# Patient Record
Sex: Female | Born: 1935
Health system: Southern US, Community
[De-identification: ages and names within clinical notes are randomized; demographics above are authoritative.]

## PROBLEM LIST (undated history)

## (undated) DIAGNOSIS — I10 Essential (primary) hypertension: Secondary | ICD-10-CM

## (undated) DIAGNOSIS — K219 Gastro-esophageal reflux disease without esophagitis: Secondary | ICD-10-CM

## (undated) DIAGNOSIS — K635 Polyp of colon: Secondary | ICD-10-CM

## (undated) DIAGNOSIS — K449 Diaphragmatic hernia without obstruction or gangrene: Secondary | ICD-10-CM

## (undated) DIAGNOSIS — E039 Hypothyroidism, unspecified: Secondary | ICD-10-CM

## (undated) DIAGNOSIS — K589 Irritable bowel syndrome without diarrhea: Secondary | ICD-10-CM

## (undated) DIAGNOSIS — E785 Hyperlipidemia, unspecified: Secondary | ICD-10-CM

## (undated) HISTORY — DX: Gastro-esophageal reflux disease without esophagitis: K21.9

## (undated) HISTORY — PX: CHOLECYSTECTOMY: SHX55

## (undated) HISTORY — PX: BACK SURGERY: SHX140

## (undated) HISTORY — PX: CATARACT EXTRACTION: SUR2

## (undated) HISTORY — DX: Irritable bowel syndrome, unspecified: K58.9

## (undated) HISTORY — PX: APPENDECTOMY: SHX54

## (undated) HISTORY — DX: Diaphragmatic hernia without obstruction or gangrene: K44.9

## (undated) HISTORY — DX: Essential (primary) hypertension: I10

## (undated) HISTORY — DX: Polyp of colon: K63.5

## (undated) HISTORY — PX: ABDOMINAL HYSTERECTOMY: SHX81

## (undated) HISTORY — DX: Hyperlipidemia, unspecified: E78.5

## (undated) HISTORY — PX: WRIST FRACTURE SURGERY: SHX121

## (undated) HISTORY — PX: CERVICAL FUSION: SHX112

## (undated) HISTORY — DX: Hypothyroidism, unspecified: E03.9

---

## 1998-06-09 ENCOUNTER — Ambulatory Visit (HOSPITAL_COMMUNITY): Admission: RE | Admit: 1998-06-09 | Discharge: 1998-06-09 | Payer: Self-pay | Admitting: Neurosurgery

## 1998-09-01 ENCOUNTER — Observation Stay (HOSPITAL_COMMUNITY): Admission: RE | Admit: 1998-09-01 | Discharge: 1998-09-02 | Payer: Self-pay | Admitting: Orthopaedic Surgery

## 1998-11-16 ENCOUNTER — Ambulatory Visit (HOSPITAL_COMMUNITY): Admission: RE | Admit: 1998-11-16 | Discharge: 1998-11-16 | Payer: Self-pay | Admitting: *Deleted

## 1999-09-01 ENCOUNTER — Other Ambulatory Visit: Admission: RE | Admit: 1999-09-01 | Discharge: 1999-09-01 | Payer: Self-pay | Admitting: Gastroenterology

## 2000-11-24 ENCOUNTER — Ambulatory Visit (HOSPITAL_COMMUNITY): Admission: RE | Admit: 2000-11-24 | Discharge: 2000-11-24 | Payer: Self-pay | Admitting: Surgery

## 2000-11-24 ENCOUNTER — Encounter: Payer: Self-pay | Admitting: Surgery

## 2004-11-04 ENCOUNTER — Ambulatory Visit: Payer: Self-pay | Admitting: Gastroenterology

## 2004-11-17 ENCOUNTER — Ambulatory Visit: Payer: Self-pay | Admitting: Gastroenterology

## 2004-11-30 ENCOUNTER — Ambulatory Visit: Payer: Self-pay | Admitting: Gastroenterology

## 2004-12-14 ENCOUNTER — Ambulatory Visit: Payer: Self-pay | Admitting: Gastroenterology

## 2004-12-15 ENCOUNTER — Ambulatory Visit (HOSPITAL_COMMUNITY): Admission: RE | Admit: 2004-12-15 | Discharge: 2004-12-15 | Payer: Self-pay | Admitting: Gastroenterology

## 2004-12-21 ENCOUNTER — Ambulatory Visit: Payer: Self-pay | Admitting: Gastroenterology

## 2006-05-03 ENCOUNTER — Encounter: Admission: RE | Admit: 2006-05-03 | Discharge: 2006-05-03 | Payer: Self-pay | Admitting: Orthopaedic Surgery

## 2006-10-23 ENCOUNTER — Encounter: Admission: RE | Admit: 2006-10-23 | Discharge: 2006-10-23 | Payer: Self-pay | Admitting: Neurosurgery

## 2009-09-18 ENCOUNTER — Encounter (INDEPENDENT_AMBULATORY_CARE_PROVIDER_SITE_OTHER): Payer: Self-pay | Admitting: *Deleted

## 2009-11-08 ENCOUNTER — Encounter: Admission: RE | Admit: 2009-11-08 | Discharge: 2009-11-08 | Payer: Self-pay | Admitting: Orthopaedic Surgery

## 2009-11-09 DIAGNOSIS — K449 Diaphragmatic hernia without obstruction or gangrene: Secondary | ICD-10-CM | POA: Insufficient documentation

## 2009-11-09 DIAGNOSIS — E039 Hypothyroidism, unspecified: Secondary | ICD-10-CM | POA: Insufficient documentation

## 2009-11-09 DIAGNOSIS — K589 Irritable bowel syndrome without diarrhea: Secondary | ICD-10-CM

## 2009-11-09 DIAGNOSIS — E785 Hyperlipidemia, unspecified: Secondary | ICD-10-CM | POA: Insufficient documentation

## 2009-11-09 DIAGNOSIS — D126 Benign neoplasm of colon, unspecified: Secondary | ICD-10-CM | POA: Insufficient documentation

## 2009-11-09 DIAGNOSIS — K219 Gastro-esophageal reflux disease without esophagitis: Secondary | ICD-10-CM

## 2009-11-10 ENCOUNTER — Ambulatory Visit: Payer: Self-pay | Admitting: Gastroenterology

## 2009-11-10 DIAGNOSIS — I10 Essential (primary) hypertension: Secondary | ICD-10-CM | POA: Insufficient documentation

## 2009-12-11 ENCOUNTER — Telehealth (INDEPENDENT_AMBULATORY_CARE_PROVIDER_SITE_OTHER): Payer: Self-pay | Admitting: *Deleted

## 2009-12-17 ENCOUNTER — Ambulatory Visit (HOSPITAL_COMMUNITY): Admission: RE | Admit: 2009-12-17 | Discharge: 2009-12-17 | Payer: Self-pay | Admitting: Gastroenterology

## 2009-12-17 ENCOUNTER — Ambulatory Visit: Payer: Self-pay | Admitting: Gastroenterology

## 2010-12-30 NOTE — Progress Notes (Signed)
Summary: Time change  Phone Note Outgoing Call Call back at [facility] Phone [phone]   Call placed by: Chales Abrahams CMA Duncan Dull),  December 11, 2009 10:27 AM Summary of Call: Pt was called and informed that her procedure time was moved up to 11 am instead of 1145 and her arrival time is now 10 am.   Initial call taken by: Chales Abrahams CMA Duncan Dull),  December 11, 2009 10:28 AM

## 2010-12-30 NOTE — Procedures (Signed)
Summary: Colonoscopy  Patient: Laura Riddle Note: All result statuses are Final unless otherwise noted.  Tests: (1) Colonoscopy (COL)   COL Colonoscopy           DONE     [facility]     [address] [place], [place]  [address]           COLONOSCOPY PROCEDURE REPORT           PATIENT:  Laura Riddle, Laura Riddle  MR#:  [id number]     BIRTHDATE:  1935/11/15, 74 yrs. old  GENDER:  female           ENDOSCOPIST:  Rachael Fee, MD     Referred by:  Vania Rea. Jarold Motto, M.D.           PROCEDURE DATE:  12/17/2009     PROCEDURE:  Colonoscopy, Diagnostic     ASA CLASS:  Class II     INDICATIONS:  Elevated Risk Screening, mother had colon cancer in     her 29's; previous colonoscopy with Dr. Jarold Motto about 5 years     ago found hyperplastic polyps only; difficult to adequately sedate     on that exam           MEDICATIONS:   MAC sedation, administered by CRNA           DESCRIPTION OF PROCEDURE:   After the risks benefits and     alternatives of the procedure were thoroughly explained, informed     consent was obtained.  Digital rectal exam was performed and     revealed no rectal masses.   The  endoscope was introduced through     the anus and advanced to the cecum, which was identified by both     the appendix and ileocecal valve, without limitations.  The     quality of the prep was excellent, using MoviPrep.  The instrument     was then slowly withdrawn as the colon was fully examined.           <<PROCEDUREIMAGES>>           FINDINGS:  Internal and external hemorrhoids were found. These     were small and not thrombosed.  This was otherwise a normal     examination of the colon (see image2, image3, and image4).     Retroflexed views in the rectum revealed no abnormalities.    The     scope was then withdrawn from the patient and the procedure     completed.           COMPLICATIONS:  None           ENDOSCOPIC IMPRESSION:     1) Small internal and external  hemorrhoids     2) Otherwise normal examination; no polyps or cancers           RECOMMENDATIONS:     1) Given your significant family history of colon cancer, you     should have a repeat colonoscopy in 5 years           REPEAT EXAM:  in 5 years with deep sedation (propofol)           ______________________________     Rachael Fee, MD           cc: Creola Corn, MD           n.     eSIGNED:   Rachael Fee at  12/17/2009 12:10 PM           Demaris Callander, [id number]  Note: An exclamation mark (!) indicates a result that was not dispersed into the flowsheet. Document Creation Date: 12/17/2009 12:10 PM _______________________________________________________________________  (1) Order result status: Final Collection or observation date-time: 12/17/2009 12:00 Requested date-time:  Receipt date-time:  Reported date-time:  Referring Physician:   Ordering Physician: Rob Bunting [phone]) Specimen Source:  Source: Launa Grill Order Number: [id number] Lab site:   Appended Document: Colonoscopy patty, she needs recall colonoscoyp in 5 years (with propofol again)  Appended Document: Colonoscopy    Clinical Lists Changes  Observations: Added new observation of COLONNXTDUE: 11/2014 (12/17/2009 13:36)      Appended Document: Colonoscopy recall in IDX and EMR

## 2011-12-30 DIAGNOSIS — M545 Low back pain: Secondary | ICD-10-CM | POA: Diagnosis not present

## 2011-12-30 DIAGNOSIS — M76899 Other specified enthesopathies of unspecified lower limb, excluding foot: Secondary | ICD-10-CM | POA: Diagnosis not present

## 2011-12-30 DIAGNOSIS — M25569 Pain in unspecified knee: Secondary | ICD-10-CM | POA: Diagnosis not present

## 2012-01-09 DIAGNOSIS — M1610 Unilateral primary osteoarthritis, unspecified hip: Secondary | ICD-10-CM | POA: Diagnosis not present

## 2012-01-24 DIAGNOSIS — M25559 Pain in unspecified hip: Secondary | ICD-10-CM | POA: Diagnosis not present

## 2012-02-10 DIAGNOSIS — M76899 Other specified enthesopathies of unspecified lower limb, excluding foot: Secondary | ICD-10-CM | POA: Diagnosis not present

## 2012-02-14 DIAGNOSIS — M25559 Pain in unspecified hip: Secondary | ICD-10-CM | POA: Diagnosis not present

## 2012-02-22 DIAGNOSIS — A088 Other specified intestinal infections: Secondary | ICD-10-CM | POA: Diagnosis not present

## 2012-02-22 DIAGNOSIS — K589 Irritable bowel syndrome without diarrhea: Secondary | ICD-10-CM | POA: Diagnosis not present

## 2012-02-22 DIAGNOSIS — F438 Other reactions to severe stress: Secondary | ICD-10-CM | POA: Diagnosis not present

## 2012-02-22 DIAGNOSIS — I959 Hypotension, unspecified: Secondary | ICD-10-CM | POA: Diagnosis not present

## 2012-03-13 DIAGNOSIS — Z961 Presence of intraocular lens: Secondary | ICD-10-CM | POA: Diagnosis not present

## 2012-03-22 DIAGNOSIS — H26499 Other secondary cataract, unspecified eye: Secondary | ICD-10-CM | POA: Diagnosis not present

## 2012-03-27 DIAGNOSIS — H6120 Impacted cerumen, unspecified ear: Secondary | ICD-10-CM | POA: Diagnosis not present

## 2012-03-27 DIAGNOSIS — H905 Unspecified sensorineural hearing loss: Secondary | ICD-10-CM | POA: Diagnosis not present

## 2012-03-28 DIAGNOSIS — S93409A Sprain of unspecified ligament of unspecified ankle, initial encounter: Secondary | ICD-10-CM | POA: Diagnosis not present

## 2012-04-09 DIAGNOSIS — S93409A Sprain of unspecified ligament of unspecified ankle, initial encounter: Secondary | ICD-10-CM | POA: Diagnosis not present

## 2012-05-02 DIAGNOSIS — E785 Hyperlipidemia, unspecified: Secondary | ICD-10-CM | POA: Diagnosis not present

## 2012-05-02 DIAGNOSIS — M949 Disorder of cartilage, unspecified: Secondary | ICD-10-CM | POA: Diagnosis not present

## 2012-05-02 DIAGNOSIS — N63 Unspecified lump in unspecified breast: Secondary | ICD-10-CM | POA: Diagnosis not present

## 2012-05-02 DIAGNOSIS — M899 Disorder of bone, unspecified: Secondary | ICD-10-CM | POA: Diagnosis not present

## 2012-05-02 DIAGNOSIS — E039 Hypothyroidism, unspecified: Secondary | ICD-10-CM | POA: Diagnosis not present

## 2012-05-03 DIAGNOSIS — R7309 Other abnormal glucose: Secondary | ICD-10-CM | POA: Diagnosis not present

## 2012-05-04 DIAGNOSIS — D239 Other benign neoplasm of skin, unspecified: Secondary | ICD-10-CM | POA: Diagnosis not present

## 2012-05-04 DIAGNOSIS — L821 Other seborrheic keratosis: Secondary | ICD-10-CM | POA: Diagnosis not present

## 2012-05-04 DIAGNOSIS — L408 Other psoriasis: Secondary | ICD-10-CM | POA: Diagnosis not present

## 2012-05-09 DIAGNOSIS — E785 Hyperlipidemia, unspecified: Secondary | ICD-10-CM | POA: Diagnosis not present

## 2012-05-09 DIAGNOSIS — R7309 Other abnormal glucose: Secondary | ICD-10-CM | POA: Diagnosis not present

## 2012-05-09 DIAGNOSIS — Z1212 Encounter for screening for malignant neoplasm of rectum: Secondary | ICD-10-CM | POA: Diagnosis not present

## 2012-05-09 DIAGNOSIS — I10 Essential (primary) hypertension: Secondary | ICD-10-CM | POA: Diagnosis not present

## 2012-05-09 DIAGNOSIS — Z0000 Encounter for general adult medical examination without abnormal findings: Secondary | ICD-10-CM | POA: Diagnosis not present

## 2012-06-07 DIAGNOSIS — H26499 Other secondary cataract, unspecified eye: Secondary | ICD-10-CM | POA: Diagnosis not present

## 2012-06-07 DIAGNOSIS — H2640 Unspecified secondary cataract: Secondary | ICD-10-CM | POA: Diagnosis not present

## 2012-07-11 DIAGNOSIS — M25559 Pain in unspecified hip: Secondary | ICD-10-CM | POA: Diagnosis not present

## 2012-09-17 DIAGNOSIS — H0019 Chalazion unspecified eye, unspecified eyelid: Secondary | ICD-10-CM | POA: Diagnosis not present

## 2012-10-08 DIAGNOSIS — J209 Acute bronchitis, unspecified: Secondary | ICD-10-CM | POA: Diagnosis not present

## 2012-10-08 DIAGNOSIS — R05 Cough: Secondary | ICD-10-CM | POA: Diagnosis not present

## 2012-10-08 DIAGNOSIS — I10 Essential (primary) hypertension: Secondary | ICD-10-CM | POA: Diagnosis not present

## 2012-10-17 DIAGNOSIS — N63 Unspecified lump in unspecified breast: Secondary | ICD-10-CM | POA: Diagnosis not present

## 2012-10-18 DIAGNOSIS — M25559 Pain in unspecified hip: Secondary | ICD-10-CM | POA: Diagnosis not present

## 2012-11-09 DIAGNOSIS — E039 Hypothyroidism, unspecified: Secondary | ICD-10-CM | POA: Diagnosis not present

## 2012-11-09 DIAGNOSIS — E785 Hyperlipidemia, unspecified: Secondary | ICD-10-CM | POA: Diagnosis not present

## 2012-11-09 DIAGNOSIS — R7301 Impaired fasting glucose: Secondary | ICD-10-CM | POA: Diagnosis not present

## 2012-11-09 DIAGNOSIS — I10 Essential (primary) hypertension: Secondary | ICD-10-CM | POA: Diagnosis not present

## 2012-12-12 DIAGNOSIS — M76899 Other specified enthesopathies of unspecified lower limb, excluding foot: Secondary | ICD-10-CM | POA: Diagnosis not present

## 2012-12-17 DIAGNOSIS — E039 Hypothyroidism, unspecified: Secondary | ICD-10-CM | POA: Diagnosis not present

## 2012-12-19 DIAGNOSIS — M76899 Other specified enthesopathies of unspecified lower limb, excluding foot: Secondary | ICD-10-CM | POA: Diagnosis not present

## 2013-01-02 DIAGNOSIS — M76899 Other specified enthesopathies of unspecified lower limb, excluding foot: Secondary | ICD-10-CM | POA: Diagnosis not present

## 2013-01-18 DIAGNOSIS — F438 Other reactions to severe stress: Secondary | ICD-10-CM | POA: Diagnosis not present

## 2013-01-18 DIAGNOSIS — M25559 Pain in unspecified hip: Secondary | ICD-10-CM | POA: Diagnosis not present

## 2013-01-18 DIAGNOSIS — E039 Hypothyroidism, unspecified: Secondary | ICD-10-CM | POA: Diagnosis not present

## 2013-01-18 DIAGNOSIS — G43909 Migraine, unspecified, not intractable, without status migrainosus: Secondary | ICD-10-CM | POA: Diagnosis not present

## 2013-03-29 DIAGNOSIS — L723 Sebaceous cyst: Secondary | ICD-10-CM | POA: Diagnosis not present

## 2013-03-29 DIAGNOSIS — L719 Rosacea, unspecified: Secondary | ICD-10-CM | POA: Diagnosis not present

## 2013-03-29 DIAGNOSIS — L408 Other psoriasis: Secondary | ICD-10-CM | POA: Diagnosis not present

## 2013-03-29 DIAGNOSIS — L820 Inflamed seborrheic keratosis: Secondary | ICD-10-CM | POA: Diagnosis not present

## 2013-03-29 DIAGNOSIS — L821 Other seborrheic keratosis: Secondary | ICD-10-CM | POA: Diagnosis not present

## 2013-04-08 DIAGNOSIS — H905 Unspecified sensorineural hearing loss: Secondary | ICD-10-CM | POA: Diagnosis not present

## 2013-04-08 DIAGNOSIS — H6120 Impacted cerumen, unspecified ear: Secondary | ICD-10-CM | POA: Diagnosis not present

## 2013-04-08 DIAGNOSIS — H903 Sensorineural hearing loss, bilateral: Secondary | ICD-10-CM | POA: Diagnosis not present

## 2013-04-18 DIAGNOSIS — M722 Plantar fascial fibromatosis: Secondary | ICD-10-CM | POA: Diagnosis not present

## 2013-04-18 DIAGNOSIS — M25579 Pain in unspecified ankle and joints of unspecified foot: Secondary | ICD-10-CM | POA: Diagnosis not present

## 2013-04-23 DIAGNOSIS — M25579 Pain in unspecified ankle and joints of unspecified foot: Secondary | ICD-10-CM | POA: Diagnosis not present

## 2013-04-23 DIAGNOSIS — M722 Plantar fascial fibromatosis: Secondary | ICD-10-CM | POA: Diagnosis not present

## 2013-04-29 DIAGNOSIS — M722 Plantar fascial fibromatosis: Secondary | ICD-10-CM | POA: Diagnosis not present

## 2013-04-29 DIAGNOSIS — M659 Synovitis and tenosynovitis, unspecified: Secondary | ICD-10-CM | POA: Diagnosis not present

## 2013-04-29 DIAGNOSIS — M25579 Pain in unspecified ankle and joints of unspecified foot: Secondary | ICD-10-CM | POA: Diagnosis not present

## 2013-05-16 DIAGNOSIS — I10 Essential (primary) hypertension: Secondary | ICD-10-CM | POA: Diagnosis not present

## 2013-05-16 DIAGNOSIS — E785 Hyperlipidemia, unspecified: Secondary | ICD-10-CM | POA: Diagnosis not present

## 2013-05-16 DIAGNOSIS — E039 Hypothyroidism, unspecified: Secondary | ICD-10-CM | POA: Diagnosis not present

## 2013-05-16 DIAGNOSIS — R82998 Other abnormal findings in urine: Secondary | ICD-10-CM | POA: Diagnosis not present

## 2013-05-16 DIAGNOSIS — M899 Disorder of bone, unspecified: Secondary | ICD-10-CM | POA: Diagnosis not present

## 2013-05-23 DIAGNOSIS — G43909 Migraine, unspecified, not intractable, without status migrainosus: Secondary | ICD-10-CM | POA: Diagnosis not present

## 2013-05-23 DIAGNOSIS — Z0000 Encounter for general adult medical examination without abnormal findings: Secondary | ICD-10-CM | POA: Diagnosis not present

## 2013-05-23 DIAGNOSIS — M722 Plantar fascial fibromatosis: Secondary | ICD-10-CM | POA: Diagnosis not present

## 2013-05-23 DIAGNOSIS — K5900 Constipation, unspecified: Secondary | ICD-10-CM | POA: Diagnosis not present

## 2013-05-23 DIAGNOSIS — M25559 Pain in unspecified hip: Secondary | ICD-10-CM | POA: Diagnosis not present

## 2013-05-23 DIAGNOSIS — Z1331 Encounter for screening for depression: Secondary | ICD-10-CM | POA: Diagnosis not present

## 2013-05-23 DIAGNOSIS — F4324 Adjustment disorder with disturbance of conduct: Secondary | ICD-10-CM | POA: Diagnosis not present

## 2013-05-23 DIAGNOSIS — I10 Essential (primary) hypertension: Secondary | ICD-10-CM | POA: Diagnosis not present

## 2013-05-23 DIAGNOSIS — G4700 Insomnia, unspecified: Secondary | ICD-10-CM | POA: Diagnosis not present

## 2013-05-24 DIAGNOSIS — Z1212 Encounter for screening for malignant neoplasm of rectum: Secondary | ICD-10-CM | POA: Diagnosis not present

## 2013-06-25 DIAGNOSIS — H52209 Unspecified astigmatism, unspecified eye: Secondary | ICD-10-CM | POA: Diagnosis not present

## 2013-06-25 DIAGNOSIS — Z961 Presence of intraocular lens: Secondary | ICD-10-CM | POA: Diagnosis not present

## 2013-10-29 DIAGNOSIS — Z803 Family history of malignant neoplasm of breast: Secondary | ICD-10-CM | POA: Diagnosis not present

## 2013-10-29 DIAGNOSIS — Z1231 Encounter for screening mammogram for malignant neoplasm of breast: Secondary | ICD-10-CM | POA: Diagnosis not present

## 2013-11-05 DIAGNOSIS — M722 Plantar fascial fibromatosis: Secondary | ICD-10-CM | POA: Diagnosis not present

## 2013-11-05 DIAGNOSIS — I10 Essential (primary) hypertension: Secondary | ICD-10-CM | POA: Diagnosis not present

## 2013-11-05 DIAGNOSIS — M899 Disorder of bone, unspecified: Secondary | ICD-10-CM | POA: Diagnosis not present

## 2013-11-05 DIAGNOSIS — E785 Hyperlipidemia, unspecified: Secondary | ICD-10-CM | POA: Diagnosis not present

## 2013-11-05 DIAGNOSIS — E039 Hypothyroidism, unspecified: Secondary | ICD-10-CM | POA: Diagnosis not present

## 2013-11-05 DIAGNOSIS — F4324 Adjustment disorder with disturbance of conduct: Secondary | ICD-10-CM | POA: Diagnosis not present

## 2013-11-05 DIAGNOSIS — M25559 Pain in unspecified hip: Secondary | ICD-10-CM | POA: Diagnosis not present

## 2013-11-05 DIAGNOSIS — G4700 Insomnia, unspecified: Secondary | ICD-10-CM | POA: Diagnosis not present

## 2013-12-03 DIAGNOSIS — L570 Actinic keratosis: Secondary | ICD-10-CM | POA: Diagnosis not present

## 2013-12-03 DIAGNOSIS — L821 Other seborrheic keratosis: Secondary | ICD-10-CM | POA: Diagnosis not present

## 2013-12-03 DIAGNOSIS — L820 Inflamed seborrheic keratosis: Secondary | ICD-10-CM | POA: Diagnosis not present

## 2014-02-20 DIAGNOSIS — M25519 Pain in unspecified shoulder: Secondary | ICD-10-CM | POA: Diagnosis not present

## 2014-02-20 DIAGNOSIS — M67919 Unspecified disorder of synovium and tendon, unspecified shoulder: Secondary | ICD-10-CM | POA: Diagnosis not present

## 2014-05-05 DIAGNOSIS — M722 Plantar fascial fibromatosis: Secondary | ICD-10-CM | POA: Diagnosis not present

## 2014-05-20 DIAGNOSIS — E039 Hypothyroidism, unspecified: Secondary | ICD-10-CM | POA: Diagnosis not present

## 2014-05-20 DIAGNOSIS — I10 Essential (primary) hypertension: Secondary | ICD-10-CM | POA: Diagnosis not present

## 2014-05-20 DIAGNOSIS — R7309 Other abnormal glucose: Secondary | ICD-10-CM | POA: Diagnosis not present

## 2014-05-20 DIAGNOSIS — M899 Disorder of bone, unspecified: Secondary | ICD-10-CM | POA: Diagnosis not present

## 2014-05-20 DIAGNOSIS — M949 Disorder of cartilage, unspecified: Secondary | ICD-10-CM | POA: Diagnosis not present

## 2014-05-20 DIAGNOSIS — E785 Hyperlipidemia, unspecified: Secondary | ICD-10-CM | POA: Diagnosis not present

## 2014-05-27 DIAGNOSIS — F4389 Other reactions to severe stress: Secondary | ICD-10-CM | POA: Diagnosis not present

## 2014-05-27 DIAGNOSIS — Z0000 Encounter for general adult medical examination without abnormal findings: Secondary | ICD-10-CM | POA: Diagnosis not present

## 2014-05-27 DIAGNOSIS — G43909 Migraine, unspecified, not intractable, without status migrainosus: Secondary | ICD-10-CM | POA: Diagnosis not present

## 2014-05-27 DIAGNOSIS — E785 Hyperlipidemia, unspecified: Secondary | ICD-10-CM | POA: Diagnosis not present

## 2014-05-27 DIAGNOSIS — Z1212 Encounter for screening for malignant neoplasm of rectum: Secondary | ICD-10-CM | POA: Diagnosis not present

## 2014-05-27 DIAGNOSIS — R042 Hemoptysis: Secondary | ICD-10-CM | POA: Diagnosis not present

## 2014-05-27 DIAGNOSIS — E039 Hypothyroidism, unspecified: Secondary | ICD-10-CM | POA: Diagnosis not present

## 2014-05-27 DIAGNOSIS — I10 Essential (primary) hypertension: Secondary | ICD-10-CM | POA: Diagnosis not present

## 2014-05-27 DIAGNOSIS — M899 Disorder of bone, unspecified: Secondary | ICD-10-CM | POA: Diagnosis not present

## 2014-05-27 DIAGNOSIS — F438 Other reactions to severe stress: Secondary | ICD-10-CM | POA: Diagnosis not present

## 2014-05-27 DIAGNOSIS — M949 Disorder of cartilage, unspecified: Secondary | ICD-10-CM | POA: Diagnosis not present

## 2014-05-27 DIAGNOSIS — Z1331 Encounter for screening for depression: Secondary | ICD-10-CM | POA: Diagnosis not present

## 2014-07-01 DIAGNOSIS — H52209 Unspecified astigmatism, unspecified eye: Secondary | ICD-10-CM | POA: Diagnosis not present

## 2014-07-01 DIAGNOSIS — Z961 Presence of intraocular lens: Secondary | ICD-10-CM | POA: Diagnosis not present

## 2014-08-05 DIAGNOSIS — H6120 Impacted cerumen, unspecified ear: Secondary | ICD-10-CM | POA: Diagnosis not present

## 2014-08-05 DIAGNOSIS — H905 Unspecified sensorineural hearing loss: Secondary | ICD-10-CM | POA: Diagnosis not present

## 2014-08-05 DIAGNOSIS — H903 Sensorineural hearing loss, bilateral: Secondary | ICD-10-CM | POA: Diagnosis not present

## 2014-08-05 DIAGNOSIS — K219 Gastro-esophageal reflux disease without esophagitis: Secondary | ICD-10-CM | POA: Diagnosis not present

## 2014-11-04 DIAGNOSIS — Z803 Family history of malignant neoplasm of breast: Secondary | ICD-10-CM | POA: Diagnosis not present

## 2014-11-04 DIAGNOSIS — Z1231 Encounter for screening mammogram for malignant neoplasm of breast: Secondary | ICD-10-CM | POA: Diagnosis not present

## 2014-11-18 DIAGNOSIS — E039 Hypothyroidism, unspecified: Secondary | ICD-10-CM | POA: Diagnosis not present

## 2014-11-18 DIAGNOSIS — K5900 Constipation, unspecified: Secondary | ICD-10-CM | POA: Diagnosis not present

## 2014-11-18 DIAGNOSIS — I10 Essential (primary) hypertension: Secondary | ICD-10-CM | POA: Diagnosis not present

## 2014-11-18 DIAGNOSIS — G43909 Migraine, unspecified, not intractable, without status migrainosus: Secondary | ICD-10-CM | POA: Diagnosis not present

## 2014-11-18 DIAGNOSIS — Z23 Encounter for immunization: Secondary | ICD-10-CM | POA: Diagnosis not present

## 2014-11-18 DIAGNOSIS — G4700 Insomnia, unspecified: Secondary | ICD-10-CM | POA: Diagnosis not present

## 2014-11-18 DIAGNOSIS — F430 Acute stress reaction: Secondary | ICD-10-CM | POA: Diagnosis not present

## 2014-11-18 DIAGNOSIS — K219 Gastro-esophageal reflux disease without esophagitis: Secondary | ICD-10-CM | POA: Diagnosis not present

## 2014-11-18 DIAGNOSIS — M722 Plantar fascial fibromatosis: Secondary | ICD-10-CM | POA: Diagnosis not present

## 2015-01-01 DIAGNOSIS — R0781 Pleurodynia: Secondary | ICD-10-CM | POA: Diagnosis not present

## 2015-01-16 ENCOUNTER — Encounter: Payer: Self-pay | Admitting: Gastroenterology

## 2015-01-26 ENCOUNTER — Encounter: Payer: Self-pay | Admitting: Gastroenterology

## 2015-01-30 ENCOUNTER — Encounter: Payer: Self-pay | Admitting: Gastroenterology

## 2015-02-26 DIAGNOSIS — J309 Allergic rhinitis, unspecified: Secondary | ICD-10-CM | POA: Diagnosis not present

## 2015-02-26 DIAGNOSIS — Z6823 Body mass index (BMI) 23.0-23.9, adult: Secondary | ICD-10-CM | POA: Diagnosis not present

## 2015-02-26 DIAGNOSIS — I10 Essential (primary) hypertension: Secondary | ICD-10-CM | POA: Diagnosis not present

## 2015-03-18 ENCOUNTER — Other Ambulatory Visit: Payer: Self-pay | Admitting: Internal Medicine

## 2015-03-18 DIAGNOSIS — R059 Cough, unspecified: Secondary | ICD-10-CM

## 2015-03-18 DIAGNOSIS — R05 Cough: Secondary | ICD-10-CM

## 2015-03-20 ENCOUNTER — Ambulatory Visit
Admission: RE | Admit: 2015-03-20 | Discharge: 2015-03-20 | Disposition: A | Discharge status: Home / Self Care | Payer: Medicare Other | Source: Ambulatory Visit | Attending: Internal Medicine | Admitting: Internal Medicine

## 2015-03-20 DIAGNOSIS — J3489 Other specified disorders of nose and nasal sinuses: Secondary | ICD-10-CM | POA: Diagnosis not present

## 2015-03-20 DIAGNOSIS — R05 Cough: Secondary | ICD-10-CM

## 2015-03-20 DIAGNOSIS — R059 Cough, unspecified: Secondary | ICD-10-CM

## 2015-03-25 ENCOUNTER — Encounter: Payer: Self-pay | Admitting: Gastroenterology

## 2015-03-25 ENCOUNTER — Other Ambulatory Visit: Payer: Self-pay

## 2015-03-25 ENCOUNTER — Ambulatory Visit (INDEPENDENT_AMBULATORY_CARE_PROVIDER_SITE_OTHER): Payer: Medicare Other | Admitting: Gastroenterology

## 2015-03-25 VITALS — BP 96/60 | HR 68 | Ht 67.0 in | Wt 158.0 lb

## 2015-03-25 DIAGNOSIS — K219 Gastro-esophageal reflux disease without esophagitis: Secondary | ICD-10-CM

## 2015-03-25 DIAGNOSIS — Z800 Family history of malignant neoplasm of digestive organs: Secondary | ICD-10-CM | POA: Diagnosis not present

## 2015-03-25 MED ORDER — MOVIPREP 100 G PO SOLR: 1.0000 | Freq: Once | ORAL | Status: DC

## 2015-03-25 NOTE — Progress Notes (Signed)
Review of pertinent gastrointestinal problems: 1. FH of colon cancer: mother had colon cancer in her 62's; previous colonoscopy with Dr. Sharlett Iles about 5 years ago found hyperplastic polyps only; difficult to adequately sedateon that exam; Colonoscopy Dr. Ardis Hughs found hemorrhoids, no polyps; recommended recall at 5 years given FH  HPI: This is a   very pleasant 80 year old woman whom I last saw about 5 years ago    CC is FH of colon cancer, mother died of colon cancer at age 40.  Also chronic GERD  Bowels are unchanged; usual intermittent constipation.  She still works part time as Radio broadcast assistant.  Has had coughing also GERD, takes omprazole PRN.  Takes tums nearly daily.      Review of systems: Pertinent positive and negative review of systems were noted in the above HPI section. Complete review of systems was performed and was otherwise normal.   Past Medical History  Diagnosis Date  . Hypertension   . Hypothyroidism   . Hyperlipidemia   . Hiatal hernia   . GERD (gastroesophageal reflux disease)   . IBS (irritable bowel syndrome)   . Colon polyps     Past Surgical History  Procedure Laterality Date  . Abdominal hysterectomy    . Cervical fusion    . Cholecystectomy    . Appendectomy      Current Outpatient Prescriptions  Medication Sig Dispense Refill  . amitriptyline (ELAVIL) 50 MG tablet Take 50 mg by mouth at bedtime.    . Ascorbic Acid (VITAMIN C) 1000 MG tablet Take 1,000 mg by mouth daily.    Marland Kitchen atorvastatin (LIPITOR) 20 MG tablet Take 20 mg by mouth daily.    . Cholecalciferol (VITAMIN D3) 2000 UNITS TABS Take 2 tablets by mouth daily.    Marland Kitchen levothyroxine (SYNTHROID, LEVOTHROID) 112 MCG tablet Take 112 mcg by mouth daily before breakfast.    . loratadine (CLARITIN) 10 MG tablet Take 10 mg by mouth daily.    . Multiple Vitamin (MULTIVITAMIN) tablet Take 1 tablet by mouth daily.    Marland Kitchen olmesartan (BENICAR) 20 MG tablet Take 20 mg by mouth daily.    Marland Kitchen omeprazole  (PRILOSEC) 20 MG capsule Take 20 mg by mouth daily.    Marland Kitchen zolpidem (AMBIEN) 10 MG tablet Take 10 mg by mouth at bedtime as needed for sleep.     No current facility-administered medications for this visit.    Allergies as of 03/25/2015 - Review Complete 03/25/2015  Allergen Reaction Noted  . Morphine    . Nitrofurantoin      Family History  Problem Relation Age of Onset  . Colon cancer Mother   . Breast cancer Sister   . Liver cancer Mother     History   Social History  . Marital Status: Married    Spouse Name: N/A  . Number of Children: 1  . Years of Education: N/A   Occupational History  . nurse    Social History Main Topics  . Smoking status: Never Smoker   . Smokeless tobacco: Never Used  . Alcohol Use: No  . Drug Use: No  . Sexual Activity: Not on file   Other Topics Concern  . Not on file   Social History Narrative     Physical Exam: BP 96/60 mmHg  Pulse 68  Ht 5\' 7"  (1.702 m)  Wt 158 lb (71.668 kg)  BMI 24.74 kg/m2 Constitutional: generally well-appearing Psychiatric: alert and oriented x3 Eyes: extraocular movements intact Mouth: oral pharynx moist, no lesions  Neck: supple no lymphadenopathy Cardiovascular: heart regular rate and rhythm Lungs: clear to auscultation bilaterally Abdomen: soft, nontender, nondistended, no obvious ascites, no peritoneal signs, normal bowel sounds Extremities: no lower extremity edema bilaterally Skin: no lesions on visible extremities   Assessment and plan: 80 y.o. female with  family history colon cancer, chronic GERD  She is a very fit, vibrant 80 year old woman who still works as a Marine scientist in [employer]. I think colon cancer screening is still a very relevant question for her and we will set her up for colonoscopy at her soonest convenience. She is also having colon cancer screening annually with Hemoccult explained to her that this is not needed since she is having colonoscopies. She has chronic GERD, probably worse  lately despite taking omeprazole on a daily basis. We'll proceed with EGD at same time as her colonoscopy. I recommended she try H2 blocker at bedtime every night on the nights which she knows she will have GERD discomforts. I see no reason for any further blood tests or imaging studies.   Owens Loffler, MD [facility] 03/25/2015, 11:11 AM

## 2015-03-25 NOTE — Patient Instructions (Addendum)
You do not need yearly hemocult testing of your stool since you are getting colonoscopy every 5 years.   You will be set up for a colonoscopy for colon cancer screening.  You will be set up for an upper endoscopy for chronic, worsening GERD. Ranitidine 150mg  pills, take one pill at bedtime as needed for GERD.

## 2015-03-26 ENCOUNTER — Telehealth: Payer: Self-pay | Admitting: Gastroenterology

## 2015-03-26 NOTE — Telephone Encounter (Signed)
Left message on machine to call back  

## 2015-03-26 NOTE — Telephone Encounter (Signed)
The pt is aware that the insurance will not cover Movi prep and she can use miralax OTC.  She states she wants to use Movi and will try and purchase this and call back with any further concerns

## 2015-03-27 ENCOUNTER — Telehealth: Payer: Self-pay | Admitting: Gastroenterology

## 2015-03-27 NOTE — Telephone Encounter (Signed)
The pt states that she was told that if our office calls [phone] she can get a tier exception.  I called the number given and was told it would go to a pharmacist for review and pt would be notified

## 2015-04-01 DIAGNOSIS — R05 Cough: Secondary | ICD-10-CM | POA: Diagnosis not present

## 2015-05-08 ENCOUNTER — Encounter: Payer: Medicare Other | Admitting: Gastroenterology

## 2015-05-11 DIAGNOSIS — M25552 Pain in left hip: Secondary | ICD-10-CM | POA: Diagnosis not present

## 2015-05-11 DIAGNOSIS — M7062 Trochanteric bursitis, left hip: Secondary | ICD-10-CM | POA: Diagnosis not present

## 2015-05-20 ENCOUNTER — Encounter: Payer: Self-pay | Admitting: Gastroenterology

## 2015-05-20 ENCOUNTER — Ambulatory Visit (AMBULATORY_SURGERY_CENTER): Payer: Medicare Other | Admitting: Gastroenterology

## 2015-05-20 VITALS — BP 132/60 | HR 61 | Temp 98.2°F | Resp 14 | Ht 67.0 in | Wt 129.0 lb

## 2015-05-20 DIAGNOSIS — K219 Gastro-esophageal reflux disease without esophagitis: Secondary | ICD-10-CM

## 2015-05-20 DIAGNOSIS — Z1211 Encounter for screening for malignant neoplasm of colon: Secondary | ICD-10-CM | POA: Diagnosis not present

## 2015-05-20 DIAGNOSIS — I10 Essential (primary) hypertension: Secondary | ICD-10-CM | POA: Diagnosis not present

## 2015-05-20 DIAGNOSIS — E039 Hypothyroidism, unspecified: Secondary | ICD-10-CM | POA: Diagnosis not present

## 2015-05-20 DIAGNOSIS — D123 Benign neoplasm of transverse colon: Secondary | ICD-10-CM | POA: Diagnosis not present

## 2015-05-20 DIAGNOSIS — F329 Major depressive disorder, single episode, unspecified: Secondary | ICD-10-CM | POA: Diagnosis not present

## 2015-05-20 DIAGNOSIS — Z800 Family history of malignant neoplasm of digestive organs: Secondary | ICD-10-CM | POA: Diagnosis not present

## 2015-05-20 DIAGNOSIS — G4700 Insomnia, unspecified: Secondary | ICD-10-CM | POA: Diagnosis not present

## 2015-05-20 DIAGNOSIS — D122 Benign neoplasm of ascending colon: Secondary | ICD-10-CM | POA: Diagnosis not present

## 2015-05-20 MED ORDER — SODIUM CHLORIDE 0.9 % IV SOLN: 500.0000 mL | INTRAVENOUS | Status: DC

## 2015-05-20 NOTE — Op Note (Signed)
[facility]  [address] [place], [address]   COLONOSCOPY PROCEDURE REPORT  PATIENT: Laura Riddle, Laura Riddle  MR#: [id number] BIRTHDATE: 08/10/1935 , 16  yrs. old GENDER: female ENDOSCOPIST: Milus Banister, MD PROCEDURE DATE:  05/20/2015 PROCEDURE:   Colonoscopy, screening and Colonoscopy with snare polypectomy First Screening Colonoscopy - Avg.  risk and is 50 yrs.  old or older - No.  Prior Negative Screening - Now for repeat screening. N/A  History of Adenoma - Now for follow-up colonoscopy & has been > or = to 3 yrs.  N/A  Polyps removed today? Yes ASA CLASS:   Class II INDICATIONS:Screening for colonic neoplasia and FH Colon or Rectal Adenocarcinoma. MEDICATIONS: Monitored anesthesia care and Propofol 300 mg IV  DESCRIPTION OF PROCEDURE:   After the risks benefits and alternatives of the procedure were thoroughly explained, informed consent was obtained.  The digital rectal exam revealed no abnormalities of the rectum.   The LB [id number]  endoscope was introduced through the anus and advanced to the cecum, which was identified by both the appendix and ileocecal valve. No adverse events experienced.   The quality of the prep was excellent.  The instrument was then slowly withdrawn as the colon was fully examined. Estimated blood loss is zero unless otherwise noted in this procedure report.  COLON FINDINGS: Two sessile polyps ranging between 3-20mm in size were found in the transverse colon and ascending colon. Polypectomies were performed with a cold snare.  The resection was complete, the polyp tissue was completely retrieved and sent to histology.   Small internal hemorrhoids were found.   The examination was otherwise normal.  Retroflexed views revealed no abnormalities. The time to cecum = 3.3 Withdrawal time = 9.1   The scope was withdrawn and the procedure completed. COMPLICATIONS: There were no immediate complications.  ENDOSCOPIC  IMPRESSION: 1. Two sessile polyps ranging between 3-84mm in size were found in the transverse colon and ascending colon; polypectomies were performed with a cold snare 2.   Small internal hemorrhoids 3.   The examination was otherwise normal  RECOMMENDATIONS: Await final pathology.  You will receive a letter within 1-2 weeks with the results of your biopsy as well as final recommendations. Please call my office if you have not received a letter after 3 weeks.  eSigned:  Milus Banister, MD 05/20/2015 1:27 PM

## 2015-05-20 NOTE — Patient Instructions (Addendum)
YOU HAD AN ENDOSCOPIC PROCEDURE TODAY AT THE [facility]:   Refer to the procedure report that was given to you for any specific questions about what was found during the examination.  If the procedure report does not answer your questions, please call your gastroenterologist to clarify.  If you requested that your care partner not be given the details of your procedure findings, then the procedure report has been included in a sealed envelope for you to review at your convenience later.  YOU SHOULD EXPECT: Some feelings of bloating in the abdomen. Passage of more gas than usual.  Walking can help get rid of the air that was put into your GI tract during the procedure and reduce the bloating. If you had a lower endoscopy (such as a colonoscopy or flexible sigmoidoscopy) you may notice spotting of blood in your stool or on the toilet paper. If you underwent a bowel prep for your procedure, you may not have a normal bowel movement for a few days.  Please Note:  You might notice some irritation and congestion in your nose or some drainage.  This is from the oxygen used during your procedure.  There is no need for concern and it should clear up in a day or so.  SYMPTOMS TO REPORT IMMEDIATELY:   Following lower endoscopy (colonoscopy or flexible sigmoidoscopy):  Excessive amounts of blood in the stool  Significant tenderness or worsening of abdominal pains  Swelling of the abdomen that is new, acute  Fever of 100F or higher   Following upper endoscopy (EGD)  Vomiting of blood or coffee ground material  New chest pain or pain under the shoulder blades  Painful or persistently difficult swallowing  New shortness of breath  Fever of 100F or higher  Black, tarry-looking stools  For urgent or emergent issues, a gastroenterologist can be reached at any hour by calling [phone].   DIET: Your first meal following the procedure should be a small meal and then it is ok to progress to  your normal diet. Heavy or fried foods are harder to digest and may make you feel nauseous or bloated.  Likewise, meals heavy in dairy and vegetables can increase bloating.  Drink plenty of fluids but you should avoid alcoholic beverages for 24 hours.  ACTIVITY:  You should plan to take it easy for the rest of today and you should NOT DRIVE or use heavy machinery until tomorrow (because of the sedation medicines used during the test).    FOLLOW UP: Our staff will call the number listed on your records the next business day following your procedure to check on you and address any questions or concerns that you may have regarding the information given to you following your procedure. If we do not reach you, we will leave a message.  However, if you are feeling well and you are not experiencing any problems, there is no need to return our call.  We will assume that you have returned to your regular daily activities without incident.  If any biopsies were taken you will be contacted by phone or by letter within the next 1-3 weeks.  Please call us at [phone] if you have not heard about the biopsies in 3 weeks.    SIGNATURES/CONFIDENTIALITY: You and/or your care partner have signed paperwork which will be entered into your electronic medical record.  These signatures attest to the fact that that the information above on your After Visit Summary has been reviewed   and is understood.  Full responsibility of the confidentiality of this discharge information lies with you and/or your care-partner.     Handouts were given to your care partner on GERD, hiatal hernia, polyps, hemorrhoids, and a high fiber diet with liberal fluid intake. You may resume your current medications today.  Continue taking as needed H2 blocker at bedtime per Dr. Ardis Hughs. Await biopsy results. Please call if any questions or concerns.

## 2015-05-20 NOTE — Op Note (Signed)
[facility]  [address]. [place], [address]   ENDOSCOPY PROCEDURE REPORT  PATIENT: Venezia, Sargeant  MR#: [id number] BIRTHDATE: November 24, 1935 , 33  yrs. old GENDER: female ENDOSCOPIST: Milus Banister, MD PROCEDURE DATE:  05/20/2015 PROCEDURE:  EGD, diagnostic ASA CLASS:     Class II INDICATIONS:  Chronic GERD. MEDICATIONS: Monitored anesthesia care and Propofol 100 mg IV TOPICAL ANESTHETIC: none  DESCRIPTION OF PROCEDURE: After the risks benefits and alternatives of the procedure were thoroughly explained, informed consent was obtained.  The LB [id number] endoscope was introduced through the mouth and advanced to the second portion of the duodenum , Without limitations.  The instrument was slowly withdrawn as the mucosa was fully examined.  There was a small hiatal hernia (2cm) and a non-obstructive, thin Schatzki's type ring.  The examination was otherwise normal. Retroflexed views revealed no abnormalities.     The scope was then withdrawn from the patient and the procedure completed. COMPLICATIONS: There were no immediate complications.  ENDOSCOPIC IMPRESSION: There was a small hiatal hernia (2cm) and a non-obstructive, thin Schatzki's type ring.  The examination was otherwise normal  RECOMMENDATIONS: Continue taking PRN H2 blocker at bedtime.   eSigned:  Milus Banister, MD 05/20/2015 1:30 PM

## 2015-05-20 NOTE — Progress Notes (Signed)
No problems noted in the recovery room. maw 

## 2015-05-20 NOTE — Progress Notes (Signed)
Called to room to assist during endoscopic procedure.  Patient ID and intended procedure confirmed with present staff. Received instructions for my participation in the procedure from the performing physician.  

## 2015-05-20 NOTE — Progress Notes (Signed)
Report to PACU, RN, vss, BBS= Clear.  

## 2015-05-21 ENCOUNTER — Telehealth: Payer: Self-pay | Admitting: Emergency Medicine

## 2015-05-21 NOTE — Telephone Encounter (Signed)
No identifier, left message to f/u 

## 2015-05-25 DIAGNOSIS — E785 Hyperlipidemia, unspecified: Secondary | ICD-10-CM | POA: Diagnosis not present

## 2015-05-25 DIAGNOSIS — R739 Hyperglycemia, unspecified: Secondary | ICD-10-CM | POA: Diagnosis not present

## 2015-05-25 DIAGNOSIS — I10 Essential (primary) hypertension: Secondary | ICD-10-CM | POA: Diagnosis not present

## 2015-05-25 DIAGNOSIS — M859 Disorder of bone density and structure, unspecified: Secondary | ICD-10-CM | POA: Diagnosis not present

## 2015-05-25 DIAGNOSIS — E039 Hypothyroidism, unspecified: Secondary | ICD-10-CM | POA: Diagnosis not present

## 2015-05-28 DIAGNOSIS — R739 Hyperglycemia, unspecified: Secondary | ICD-10-CM | POA: Diagnosis not present

## 2015-05-28 DIAGNOSIS — F430 Acute stress reaction: Secondary | ICD-10-CM | POA: Diagnosis not present

## 2015-05-28 DIAGNOSIS — E785 Hyperlipidemia, unspecified: Secondary | ICD-10-CM | POA: Diagnosis not present

## 2015-05-28 DIAGNOSIS — J309 Allergic rhinitis, unspecified: Secondary | ICD-10-CM | POA: Diagnosis not present

## 2015-05-28 DIAGNOSIS — Z6824 Body mass index (BMI) 24.0-24.9, adult: Secondary | ICD-10-CM | POA: Diagnosis not present

## 2015-05-28 DIAGNOSIS — I10 Essential (primary) hypertension: Secondary | ICD-10-CM | POA: Diagnosis not present

## 2015-05-28 DIAGNOSIS — Z1389 Encounter for screening for other disorder: Secondary | ICD-10-CM | POA: Diagnosis not present

## 2015-05-28 DIAGNOSIS — Z0000 Encounter for general adult medical examination without abnormal findings: Secondary | ICD-10-CM | POA: Diagnosis not present

## 2015-05-28 DIAGNOSIS — I878 Other specified disorders of veins: Secondary | ICD-10-CM | POA: Diagnosis not present

## 2015-05-28 DIAGNOSIS — K5900 Constipation, unspecified: Secondary | ICD-10-CM | POA: Diagnosis not present

## 2015-05-28 DIAGNOSIS — E039 Hypothyroidism, unspecified: Secondary | ICD-10-CM | POA: Diagnosis not present

## 2015-05-28 DIAGNOSIS — G43909 Migraine, unspecified, not intractable, without status migrainosus: Secondary | ICD-10-CM | POA: Diagnosis not present

## 2015-05-29 ENCOUNTER — Encounter: Payer: Self-pay | Admitting: Gastroenterology

## 2015-07-06 DIAGNOSIS — Z961 Presence of intraocular lens: Secondary | ICD-10-CM | POA: Diagnosis not present

## 2015-07-06 DIAGNOSIS — H52203 Unspecified astigmatism, bilateral: Secondary | ICD-10-CM | POA: Diagnosis not present

## 2015-10-30 DIAGNOSIS — M2550 Pain in unspecified joint: Secondary | ICD-10-CM | POA: Diagnosis not present

## 2015-10-30 DIAGNOSIS — M25511 Pain in right shoulder: Secondary | ICD-10-CM | POA: Diagnosis not present

## 2015-11-10 DIAGNOSIS — M859 Disorder of bone density and structure, unspecified: Secondary | ICD-10-CM | POA: Diagnosis not present

## 2015-12-01 DIAGNOSIS — Z1231 Encounter for screening mammogram for malignant neoplasm of breast: Secondary | ICD-10-CM | POA: Diagnosis not present

## 2015-12-01 DIAGNOSIS — Z803 Family history of malignant neoplasm of breast: Secondary | ICD-10-CM | POA: Diagnosis not present

## 2016-02-18 DIAGNOSIS — M722 Plantar fascial fibromatosis: Secondary | ICD-10-CM | POA: Diagnosis not present

## 2016-02-19 DIAGNOSIS — J209 Acute bronchitis, unspecified: Secondary | ICD-10-CM | POA: Diagnosis not present

## 2016-02-19 DIAGNOSIS — Z6825 Body mass index (BMI) 25.0-25.9, adult: Secondary | ICD-10-CM | POA: Diagnosis not present

## 2016-02-19 DIAGNOSIS — R05 Cough: Secondary | ICD-10-CM | POA: Diagnosis not present

## 2016-05-26 DIAGNOSIS — N390 Urinary tract infection, site not specified: Secondary | ICD-10-CM | POA: Diagnosis not present

## 2016-05-26 DIAGNOSIS — E784 Other hyperlipidemia: Secondary | ICD-10-CM | POA: Diagnosis not present

## 2016-05-26 DIAGNOSIS — R739 Hyperglycemia, unspecified: Secondary | ICD-10-CM | POA: Diagnosis not present

## 2016-05-26 DIAGNOSIS — R8290 Unspecified abnormal findings in urine: Secondary | ICD-10-CM | POA: Diagnosis not present

## 2016-05-26 DIAGNOSIS — M859 Disorder of bone density and structure, unspecified: Secondary | ICD-10-CM | POA: Diagnosis not present

## 2016-05-26 DIAGNOSIS — E038 Other specified hypothyroidism: Secondary | ICD-10-CM | POA: Diagnosis not present

## 2016-06-01 DIAGNOSIS — H00012 Hordeolum externum right lower eyelid: Secondary | ICD-10-CM | POA: Diagnosis not present

## 2016-07-12 DIAGNOSIS — H52203 Unspecified astigmatism, bilateral: Secondary | ICD-10-CM | POA: Diagnosis not present

## 2016-07-12 DIAGNOSIS — Z961 Presence of intraocular lens: Secondary | ICD-10-CM | POA: Diagnosis not present

## 2016-07-12 DIAGNOSIS — H0012 Chalazion right lower eyelid: Secondary | ICD-10-CM | POA: Diagnosis not present

## 2016-11-29 DIAGNOSIS — D649 Anemia, unspecified: Secondary | ICD-10-CM | POA: Diagnosis not present

## 2016-12-21 DIAGNOSIS — Z1231 Encounter for screening mammogram for malignant neoplasm of breast: Secondary | ICD-10-CM | POA: Diagnosis not present

## 2016-12-21 DIAGNOSIS — Z803 Family history of malignant neoplasm of breast: Secondary | ICD-10-CM | POA: Diagnosis not present

## 2017-02-13 ENCOUNTER — Ambulatory Visit (INDEPENDENT_AMBULATORY_CARE_PROVIDER_SITE_OTHER): Payer: Self-pay | Admitting: Orthopaedic Surgery

## 2017-02-20 ENCOUNTER — Ambulatory Visit (INDEPENDENT_AMBULATORY_CARE_PROVIDER_SITE_OTHER): Payer: Medicare Other | Admitting: Orthopaedic Surgery

## 2017-02-20 ENCOUNTER — Encounter (INDEPENDENT_AMBULATORY_CARE_PROVIDER_SITE_OTHER): Payer: Self-pay | Admitting: Orthopaedic Surgery

## 2017-02-20 VITALS — BP 125/75 | HR 70 | Resp 14 | Ht 66.0 in | Wt 129.0 lb

## 2017-02-20 DIAGNOSIS — M79672 Pain in left foot: Secondary | ICD-10-CM | POA: Diagnosis not present

## 2017-02-20 MED ORDER — LIDOCAINE HCL 1 % IJ SOLN
1.0000 mL | INTRAMUSCULAR | Status: AC | PRN
  Administered 2017-02-20: 1 mL

## 2017-02-20 MED ORDER — METHYLPREDNISOLONE ACETATE 40 MG/ML IJ SUSP
40.0000 mg | INTRAMUSCULAR | Status: AC | PRN
  Administered 2017-02-20: 40 mg

## 2017-02-20 NOTE — Progress Notes (Signed)
   Office Visit Note   Patient: Laura Riddle           Date of Birth: Dec 13, 1934           MRN: [id number] Visit Date: 02/20/2017              Requested by: Shon Baton, MD [address] [place], [place] [address] PCP: Precious Reel, MD   Assessment & Plan: Visit Diagnoses:recurrent plantar fasciitis left heel/foot   Plan: Cortisone injection left heel follow up as needed and continue with stretching and icing  Follow-Up Instructions: No Follow-up on file.   Orders:  No orders of the defined types were placed in this encounter.  No orders of the defined types were placed in this encounter.     Procedures: Foot Inj Date/Time: 02/20/2017 10:22 AM Performed by: Garald Balding Authorized by: Garald Balding   Consent Given by:  Patient Site marked: the procedure site was marked   Indications:  Fasciitis Condition: Plantar Fasciitis   Location: left plantar fascia muscle   Needle Size:  27 G Medications:  40 mg methylPREDNISolone acetate 40 MG/ML; 1 mL lidocaine 1 % Patient Tolerance:  Patient tolerated the procedure well with no immediate complications     Clinical Data: No additional findings.   Subjective: No chief complaint on file.   Ms. Laura Riddle is a 82 year old female that has chronic left heel pain. 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  She has her last injection 02/18/16. She relates this has been ongoing for 6 weeks.   No history of injury or trauma. Pain is localized on the plantar aspect of the left heel without induration or ecchymosis. No neurovascular exam related to be intact. Burkley has been stretching and applying ice  Review of Systems   Objective: Vital Signs: There were no vitals taken for this visit.  Physical Exam  Ortho Exam left foot exam demonstrated localized tenderness on the plantar fascial attachment the left os calcis. No induration, no ecchymosis. Her vascular exam is intact. No posterior heel pain. No dorsal  foot pain.  Specialty Comments:  No specialty comments available.  Imaging: No results found.   PMFS History: Patient Active Problem List   Diagnosis Date Noted  . HYPERTENSION 11/10/2009  . COLONIC POLYPS, HYPERPLASTIC 11/09/2009  . HYPOTHYROIDISM 11/09/2009  . HYPERLIPIDEMIA 11/09/2009  . GERD 11/09/2009  . HIATAL HERNIA 11/09/2009  . IBS 11/09/2009   Past Medical History:  Diagnosis Date  . Colon polyps   . GERD (gastroesophageal reflux disease)   . Hiatal hernia   . Hyperlipidemia   . Hypertension   . Hypothyroidism   . IBS (irritable bowel syndrome)     Family History  Problem Relation Age of Onset  . Colon cancer Mother   . Breast cancer Sister   . Liver cancer Mother     Past Surgical History:  Procedure Laterality Date  . ABDOMINAL HYSTERECTOMY    . APPENDECTOMY    . CATARACT EXTRACTION    . CERVICAL FUSION    . CHOLECYSTECTOMY     Social History   Occupational History  . nurse    Social History Main Topics  . Smoking status: Never Smoker  . Smokeless tobacco: Never Used  . Alcohol use No  . Drug use: No  . Sexual activity: Not on file

## 2017-04-18 ENCOUNTER — Ambulatory Visit (INDEPENDENT_AMBULATORY_CARE_PROVIDER_SITE_OTHER): Payer: Medicare Other

## 2017-04-18 ENCOUNTER — Encounter (INDEPENDENT_AMBULATORY_CARE_PROVIDER_SITE_OTHER): Payer: Self-pay | Admitting: Orthopaedic Surgery

## 2017-04-18 ENCOUNTER — Ambulatory Visit (INDEPENDENT_AMBULATORY_CARE_PROVIDER_SITE_OTHER): Payer: Medicare Other | Admitting: Orthopaedic Surgery

## 2017-04-18 VITALS — Ht 68.0 in | Wt 129.0 lb

## 2017-04-18 DIAGNOSIS — M25532 Pain in left wrist: Secondary | ICD-10-CM | POA: Diagnosis not present

## 2017-04-18 NOTE — Progress Notes (Signed)
Office Visit Note   Patient: Laura Riddle           Date of Birth: 1935/02/07           MRN: [id number] Visit Date: 04/18/2017              Requested by: Shon Baton, [place], [place] [address] PCP: Shon Baton, MD   Assessment & Plan: Visit Diagnoses:  1. Pain in left wrist   Probable nondisplaced navicular fracture  Plan: Immobilize with thumb spica splint. Repeat films in 2 weeks  Follow-Up Instructions: Return in about 2 weeks (around 05/02/2017).   Orders:  Orders Placed This Encounter  Procedures  . XR Wrist Complete Left   No orders of the defined types were placed in this encounter.     Procedures: No procedures performed   Clinical Data: No additional findings.   Subjective: Chief Complaint  Patient presents with  . Left Wrist - Injury, Pain    Laura Riddle is a 82 y o that presents with a Left wrist injury climbing down  2 steps from bed in the night. Slipped and caught herself on bedpost Sunday night. Ecchymosis, edema and tenderness.   Has developed ecchymosis and discomfort along the ulnar aspect of her wrist but also some tenderness near the base of her thumb. He also has some mild discomfort in the area of the coccyx but is able to sit comfortably HPI  Review of Systems   Objective: Vital Signs: Ht 5\' 8"  (1.727 m)   Wt 129 lb (58.5 kg)   BMI 19.61 kg/m   Physical Exam  Ortho Exam left wrist exam reveals tenderness diffusely about the distal ulna and along the ulnar aspect of the carpus. Resolving ecchymosis. Skin intact. Neurovascular exam intact to the fingers. He tenderness in the snuffbox without ecchymosis. No distal radius pain. Some discomfort the base of the thumb with a minimally positive grind test.  Specialty Comments:  No specialty comments available.  Imaging: Xr Wrist Complete Left  Result Date: 04/18/2017 Films of the left wrist were obtained in 3 projections. There appears to be a nondisplaced fracture  through the waist of the navicular. There are also degenerative changes at the base of the thumb. No evidence of distal radius or ulna fracture.    PMFS History: Patient Active Problem List   Diagnosis Date Noted  . HYPERTENSION 11/10/2009  . COLONIC POLYPS, HYPERPLASTIC 11/09/2009  . HYPOTHYROIDISM 11/09/2009  . HYPERLIPIDEMIA 11/09/2009  . GERD 11/09/2009  . HIATAL HERNIA 11/09/2009  . IBS 11/09/2009   Past Medical History:  Diagnosis Date  . Colon polyps   . GERD (gastroesophageal reflux disease)   . Hiatal hernia   . Hyperlipidemia   . Hypertension   . Hypothyroidism   . IBS (irritable bowel syndrome)     Family History  Problem Relation Age of Onset  . Colon cancer Mother   . Breast cancer Sister   . Liver cancer Mother     Past Surgical History:  Procedure Laterality Date  . ABDOMINAL HYSTERECTOMY    . APPENDECTOMY    . CATARACT EXTRACTION    . CERVICAL FUSION    . CHOLECYSTECTOMY     Social History   Occupational History  . nurse    Social History Main Topics  . Smoking status: Never Smoker  . Smokeless tobacco: Never Used  . Alcohol use No  . Drug use: No  . Sexual activity: Not on file  Garald Balding, MD   Note - This record has been created using [employer].  Chart creation errors have been sought, but may not always  have been located. Such creation errors do not reflect on  the standard of medical care.

## 2017-04-21 ENCOUNTER — Other Ambulatory Visit (INDEPENDENT_AMBULATORY_CARE_PROVIDER_SITE_OTHER): Payer: Self-pay

## 2017-04-21 ENCOUNTER — Telehealth (INDEPENDENT_AMBULATORY_CARE_PROVIDER_SITE_OTHER): Payer: Self-pay | Admitting: Orthopaedic Surgery

## 2017-04-21 MED ORDER — METHOCARBAMOL 500 MG PO TABS: 500.0000 mg | ORAL_TABLET | Freq: Two times a day (BID) | ORAL | 0 refills | Status: DC | PRN

## 2017-04-21 NOTE — Telephone Encounter (Signed)
Patient left a message wanting to know if Dr. Durward Fortes will call in a muscle relaxer as she is now having muscle spasms in her back.  [facility] [address].  CB#[phone].  Thank you.

## 2017-04-21 NOTE — Telephone Encounter (Signed)
Called pw and sent Robaxin to pharmacy

## 2017-05-01 ENCOUNTER — Ambulatory Visit (INDEPENDENT_AMBULATORY_CARE_PROVIDER_SITE_OTHER): Payer: Medicare Other | Admitting: Orthopaedic Surgery

## 2017-05-01 ENCOUNTER — Ambulatory Visit (INDEPENDENT_AMBULATORY_CARE_PROVIDER_SITE_OTHER): Payer: Medicare Other

## 2017-05-01 ENCOUNTER — Encounter (INDEPENDENT_AMBULATORY_CARE_PROVIDER_SITE_OTHER): Payer: Self-pay

## 2017-05-01 ENCOUNTER — Encounter (INDEPENDENT_AMBULATORY_CARE_PROVIDER_SITE_OTHER): Payer: Self-pay | Admitting: Orthopaedic Surgery

## 2017-05-01 VITALS — BP 152/74 | HR 84 | Ht 66.0 in | Wt 129.0 lb

## 2017-05-01 DIAGNOSIS — M25532 Pain in left wrist: Secondary | ICD-10-CM

## 2017-05-01 NOTE — Addendum Note (Signed)
Addended by: Mervyn Skeeters on: 05/01/2017 03:53 PM   Modules accepted: Orders

## 2017-05-01 NOTE — Progress Notes (Signed)
Office Visit Note   Patient: Laura Riddle           Date of Birth: 03-Feb-1935           MRN: [id number] Visit Date: 05/01/2017              Requested by: Shon Baton, [address] [place], [place] [address] PCP: Shon Baton, MD   Assessment & Plan: Visit Diagnoses:  1. Pain in left wrist   Repeat films performed today are consistent with a nondisplaced fractures of the waist of the left navicular. There are also significant degenerative changes at the base of the thumb and the distal carpus which certainly could mimic the  Navicular fracture pain. I think it's worth with obtaining a localized CT scan of her wrist to confirm the presence of a fracture.  Plan: May return to work using the volar wrist splint that incorporates the thumb with no direct patient care. We will consider further treatment based on results of the CT scan  Follow-Up Instructions: No Follow-up on file.   Orders:  Orders Placed This Encounter  Procedures  . XR Wrist 2 Views Left   No orders of the defined types were placed in this encounter.     Procedures: No procedures performed   Clinical Data: No additional findings.   Subjective: Chief Complaint  Patient presents with  . Left Wrist - Follow-up    Pain in left wrist  Probable nondisplaced navicular fracture Plan: Immobilize with thumb spica splint. She would like to return to work with limited activity.   Comfortable in the thumb spica splint. No numbness or tingling  HPI  Review of Systems   Objective: Vital Signs: BP (!) 152/74   Pulse 84   Ht 5\' 6"  (1.676 m)   Wt 129 lb (58.5 kg)   BMI 20.82 kg/m   Physical Exam  Ortho Exam pain in the snuffbox left wrist. Also having pain and positive grind test at the base of the thumb. No swelling. Skin intact. Neurovascular exam intact  Specialty Comments:  No specialty comments available.  Imaging: Xr Wrist 2 Views Left  Result Date: 05/01/2017 Films of the left wrist and  specifically the left navicular were obtained in several projections. There appears to be a nondisplaced fracture through the waist the navicular. There are also significant degenerative changes at the base of the thumb i.e. metacarpal joint of the thumb and also at the greater multangular navicular articulation    PMFS History: Patient Active Problem List   Diagnosis Date Noted  . HYPERTENSION 11/10/2009  . COLONIC POLYPS, HYPERPLASTIC 11/09/2009  . HYPOTHYROIDISM 11/09/2009  . HYPERLIPIDEMIA 11/09/2009  . GERD 11/09/2009  . HIATAL HERNIA 11/09/2009  . IBS 11/09/2009   Past Medical History:  Diagnosis Date  . Colon polyps   . GERD (gastroesophageal reflux disease)   . Hiatal hernia   . Hyperlipidemia   . Hypertension   . Hypothyroidism   . IBS (irritable bowel syndrome)     Family History  Problem Relation Age of Onset  . Colon cancer Mother   . Breast cancer Sister   . Liver cancer Mother     Past Surgical History:  Procedure Laterality Date  . ABDOMINAL HYSTERECTOMY    . APPENDECTOMY    . CATARACT EXTRACTION    . CERVICAL FUSION    . CHOLECYSTECTOMY     Social History   Occupational History  . nurse    Social History Main Topics  .  Smoking status: Never Smoker  . Smokeless tobacco: Never Used  . Alcohol use No  . Drug use: No  . Sexual activity: Not on file     Garald Balding, MD   Note - This record has been created using [employer].  Chart creation errors have been sought, but may not always  have been located. Such creation errors do not reflect on  the standard of medical care.

## 2017-05-03 ENCOUNTER — Ambulatory Visit
Admission: RE | Admit: 2017-05-03 | Discharge: 2017-05-03 | Disposition: A | Discharge status: Home / Self Care | Payer: Medicare Other | Source: Ambulatory Visit | Attending: Orthopaedic Surgery | Admitting: Orthopaedic Surgery

## 2017-05-03 ENCOUNTER — Other Ambulatory Visit: Payer: Medicare Other

## 2017-05-03 DIAGNOSIS — M25532 Pain in left wrist: Secondary | ICD-10-CM

## 2017-05-03 DIAGNOSIS — S62012A Displaced fracture of distal pole of navicular [scaphoid] bone of left wrist, initial encounter for closed fracture: Secondary | ICD-10-CM | POA: Diagnosis not present

## 2017-05-22 ENCOUNTER — Ambulatory Visit (INDEPENDENT_AMBULATORY_CARE_PROVIDER_SITE_OTHER): Payer: Medicare Other | Admitting: Orthopaedic Surgery

## 2017-05-22 ENCOUNTER — Ambulatory Visit (INDEPENDENT_AMBULATORY_CARE_PROVIDER_SITE_OTHER): Payer: Medicare Other

## 2017-05-22 DIAGNOSIS — M25532 Pain in left wrist: Secondary | ICD-10-CM | POA: Diagnosis not present

## 2017-05-22 NOTE — Progress Notes (Signed)
   Office Visit Note   Patient: Laura Riddle           Date of Birth: 11-24-1935           MRN: [id number] Visit Date: 05/22/2017              Requested by: Shon Baton, [place] [place], [place] [address] PCP: Shon Baton, MD   Assessment & Plan: Visit Diagnoses:  1. Pain in left wrist   5 weeks status post nondisplaced fracture through the waist of the navicular. Appears to be doing well with no change in x-rays.  Plan: With thumb spica splint for an additional 3 weeks. Return at that time for reevaluation and repeat films  Follow-Up Instructions: Return in about 3 weeks (around 06/12/2017).   Orders:  Orders Placed This Encounter  Procedures  . XR Wrist Complete Left   No orders of the defined types were placed in this encounter.     Procedures: No procedures performed   Clinical Data: No additional findings.   Subjective: Chief Complaint  Patient presents with  . Left Wrist - Pain  Wearing thumb spica splint religiously. Minimal discomfort  HPI  Review of Systems   Objective: Vital Signs: There were no vitals taken for this visit.  Physical Exam  Ortho Exam minimal pain in  snuff box left wrist. Positive grind test with pain at the base of the thumb related to the arthritis  Specialty Comments:  No specialty comments available.  Imaging: Xr Wrist Complete Left  Result Date: 05/22/2017 Films of the left wrist were obtained in several projections including the navicular views. There is no change in the appearance of the navicular fracture. There is a very faint line through the waist of the navicular representing no change. There are degenerative changes at the base of the thumb    PMFS History: Patient Active Problem List   Diagnosis Date Noted  . HYPERTENSION 11/10/2009  . COLONIC POLYPS, HYPERPLASTIC 11/09/2009  . HYPOTHYROIDISM 11/09/2009  . HYPERLIPIDEMIA 11/09/2009  . GERD 11/09/2009  . HIATAL HERNIA 11/09/2009  . IBS  11/09/2009   Past Medical History:  Diagnosis Date  . Colon polyps   . GERD (gastroesophageal reflux disease)   . Hiatal hernia   . Hyperlipidemia   . Hypertension   . Hypothyroidism   . IBS (irritable bowel syndrome)     Family History  Problem Relation Age of Onset  . Colon cancer Mother   . Breast cancer Sister   . Liver cancer Mother     Past Surgical History:  Procedure Laterality Date  . ABDOMINAL HYSTERECTOMY    . APPENDECTOMY    . CATARACT EXTRACTION    . CERVICAL FUSION    . CHOLECYSTECTOMY     Social History   Occupational History  . nurse    Social History Main Topics  . Smoking status: Never Smoker  . Smokeless tobacco: Never Used  . Alcohol use No  . Drug use: No  . Sexual activity: Not on file     Garald Balding, MD   Note - This record has been created using [employer].  Chart creation errors have been sought, but may not always  have been located. Such creation errors do not reflect on  the standard of medical care.

## 2017-06-05 ENCOUNTER — Telehealth (INDEPENDENT_AMBULATORY_CARE_PROVIDER_SITE_OTHER): Payer: Self-pay | Admitting: Orthopaedic Surgery

## 2017-06-05 NOTE — Telephone Encounter (Signed)
Faxed to correct number

## 2017-06-05 NOTE — Telephone Encounter (Signed)
Wrong patinet. She will make an appt for Tuesday.

## 2017-06-05 NOTE — Telephone Encounter (Signed)
Patient is here now. Would like another immobilizer for her wrist. ? If need to xray wrist sooner than rov due to increased pain. Please advise.

## 2017-06-06 ENCOUNTER — Ambulatory Visit (INDEPENDENT_AMBULATORY_CARE_PROVIDER_SITE_OTHER): Payer: Medicare Other | Admitting: Orthopaedic Surgery

## 2017-06-06 ENCOUNTER — Ambulatory Visit (INDEPENDENT_AMBULATORY_CARE_PROVIDER_SITE_OTHER): Payer: Medicare Other

## 2017-06-06 VITALS — BP 163/73 | HR 98 | Resp 14 | Ht 64.0 in | Wt 135.0 lb

## 2017-06-06 DIAGNOSIS — I10 Essential (primary) hypertension: Secondary | ICD-10-CM | POA: Diagnosis not present

## 2017-06-06 DIAGNOSIS — N390 Urinary tract infection, site not specified: Secondary | ICD-10-CM | POA: Diagnosis not present

## 2017-06-06 DIAGNOSIS — E038 Other specified hypothyroidism: Secondary | ICD-10-CM | POA: Diagnosis not present

## 2017-06-06 DIAGNOSIS — R7309 Other abnormal glucose: Secondary | ICD-10-CM | POA: Diagnosis not present

## 2017-06-06 DIAGNOSIS — M859 Disorder of bone density and structure, unspecified: Secondary | ICD-10-CM | POA: Diagnosis not present

## 2017-06-06 DIAGNOSIS — M25532 Pain in left wrist: Secondary | ICD-10-CM | POA: Diagnosis not present

## 2017-06-06 DIAGNOSIS — R8299 Other abnormal findings in urine: Secondary | ICD-10-CM | POA: Diagnosis not present

## 2017-06-06 DIAGNOSIS — E784 Other hyperlipidemia: Secondary | ICD-10-CM | POA: Diagnosis not present

## 2017-06-06 NOTE — Progress Notes (Signed)
   Office Visit Note   Patient: Laura Riddle           Date of Birth: 02-Nov-1935           MRN: [id number] Visit Date: 06/06/2017              Requested by: Shon Baton, [address] [place], [place] [address] PCP: Shon Baton, MD   Assessment & Plan: Visit Diagnoses:  1. Pain in left wrist   Seven-week status post nondisplaced navicular fracture left wrist  Plan: Wean from splint. Office 1 week  Follow-Up Instructions: No Follow-up on file.   Orders:  Orders Placed This Encounter  Procedures  . XR Wrist Complete Left   No orders of the defined types were placed in this encounter.     Procedures: No procedures performed   Clinical Data: No additional findings.   Subjective: Chief Complaint  Patient presents with  . Left Wrist - Pain  Seven-week status post nondisplaced fracture at the waist of the left navicular. Has been compliant using the thumb spica splint. Having some discomfort but not in the area of the snuffbox. No numbness or tingling  HPI  Review of Systems   Objective: Vital Signs: BP (!) 163/73   Pulse 98   Resp 14   Ht 5\' 4"  (1.626 m)   Wt 135 lb (61.2 kg)   BMI 23.17 kg/m   Physical Exam  Ortho Exam left wrist with some pain at the base of the thumb and positive grind test. Minimal discomfort in the snuffbox. Skin dry but otherwise intact. Neurovascular exam intact.  Specialty Comments:  No specialty comments available.  Imaging: Xr Wrist Complete Left  Result Date: 06/06/2017 Films of the left wrist were obtained and compared to prior films. The fracture at the waist of the navicular is less distinct today than it has been in the past. No displacement. Arthritic changes at the base of the thumb without change    PMFS History: Patient Active Problem List   Diagnosis Date Noted  . HYPERTENSION 11/10/2009  . COLONIC POLYPS, HYPERPLASTIC 11/09/2009  . HYPOTHYROIDISM 11/09/2009  . HYPERLIPIDEMIA 11/09/2009  . GERD  11/09/2009  . HIATAL HERNIA 11/09/2009  . IBS 11/09/2009   Past Medical History:  Diagnosis Date  . Colon polyps   . GERD (gastroesophageal reflux disease)   . Hiatal hernia   . Hyperlipidemia   . Hypertension   . Hypothyroidism   . IBS (irritable bowel syndrome)     Family History  Problem Relation Age of Onset  . Colon cancer Mother   . Breast cancer Sister   . Liver cancer Mother     Past Surgical History:  Procedure Laterality Date  . ABDOMINAL HYSTERECTOMY    . APPENDECTOMY    . CATARACT EXTRACTION    . CERVICAL FUSION    . CHOLECYSTECTOMY     Social History   Occupational History  . nurse    Social History Main Topics  . Smoking status: Never Smoker  . Smokeless tobacco: Never Used  . Alcohol use No  . Drug use: No  . Sexual activity: Not on file     Garald Balding, MD   Note - This record has been created using [employer].  Chart creation errors have been sought, but may not always  have been located. Such creation errors do not reflect on  the standard of medical care.

## 2017-06-16 ENCOUNTER — Ambulatory Visit (INDEPENDENT_AMBULATORY_CARE_PROVIDER_SITE_OTHER): Payer: Medicare Other | Admitting: Orthopaedic Surgery

## 2017-06-16 DIAGNOSIS — S62025D Nondisplaced fracture of middle third of navicular [scaphoid] bone of left wrist, subsequent encounter for fracture with routine healing: Secondary | ICD-10-CM

## 2017-06-16 NOTE — Progress Notes (Signed)
   Office Visit Note   Patient: Laura Riddle           Date of Birth: 1935/09/15           MRN: [id number] Visit Date: 06/16/2017              Requested by: Shon Baton, [address], [place] [address] PCP: Shon Baton, MD   Assessment & Plan: Visit Diagnoses:  1. Closed nondisplaced fracture of middle third of scaphoid of left wrist with routine healing, subsequent encounter     Plan: Wean from thumb spica splint, note continue light duty activity at work for 2 weeks. Reevaluate in 2 weeks. No need for further films unless there is a change in her symptoms  Follow-Up Instructions: Return in about 2 weeks (around 06/30/2017).   Orders:  No orders of the defined types were placed in this encounter.  No orders of the defined types were placed in this encounter.     Procedures: No procedures performed   Clinical Data: No additional findings.   Subjective: No chief complaint on file. Minimal discomfort left wrist tickly no significant pain in the snuffbox  HPI  Review of Systems   Objective: Vital Signs: There were no vitals taken for this visit.  Physical Exam  Ortho Exam exam out of splint left wrist with no tenderness over the snuffbox some pain at the base of the thumb where there is some degenerative change in skin intact. Neurovascular exam intact. Good range of motion of wrist  Specialty Comments:  No specialty comments available.  Imaging: No results found.   PMFS History: Patient Active Problem List   Diagnosis Date Noted  . HYPERTENSION 11/10/2009  . COLONIC POLYPS, HYPERPLASTIC 11/09/2009  . HYPOTHYROIDISM 11/09/2009  . HYPERLIPIDEMIA 11/09/2009  . GERD 11/09/2009  . HIATAL HERNIA 11/09/2009  . IBS 11/09/2009   Past Medical History:  Diagnosis Date  . Colon polyps   . GERD (gastroesophageal reflux disease)   . Hiatal hernia   . Hyperlipidemia   . Hypertension   . Hypothyroidism   . IBS (irritable bowel syndrome)       Family History  Problem Relation Age of Onset  . Colon cancer Mother   . Breast cancer Sister   . Liver cancer Mother     Past Surgical History:  Procedure Laterality Date  . ABDOMINAL HYSTERECTOMY    . APPENDECTOMY    . CATARACT EXTRACTION    . CERVICAL FUSION    . CHOLECYSTECTOMY     Social History   Occupational History  . nurse    Social History Main Topics  . Smoking status: Never Smoker  . Smokeless tobacco: Never Used  . Alcohol use No  . Drug use: No  . Sexual activity: Not on file     Garald Balding, MD   Note - This record has been created using [employer].  Chart creation errors have been sought, but may not always  have been located. Such creation errors do not reflect on  the standard of medical care.

## 2017-06-20 DIAGNOSIS — Z1212 Encounter for screening for malignant neoplasm of rectum: Secondary | ICD-10-CM | POA: Diagnosis not present

## 2017-06-29 ENCOUNTER — Encounter (INDEPENDENT_AMBULATORY_CARE_PROVIDER_SITE_OTHER): Payer: Self-pay | Admitting: Orthopaedic Surgery

## 2017-06-29 ENCOUNTER — Ambulatory Visit (INDEPENDENT_AMBULATORY_CARE_PROVIDER_SITE_OTHER): Payer: Medicare Other | Admitting: Orthopaedic Surgery

## 2017-06-29 ENCOUNTER — Ambulatory Visit (INDEPENDENT_AMBULATORY_CARE_PROVIDER_SITE_OTHER): Payer: Medicare Other

## 2017-06-29 VITALS — BP 167/70 | HR 70 | Ht 64.0 in | Wt 130.0 lb

## 2017-06-29 DIAGNOSIS — M25532 Pain in left wrist: Secondary | ICD-10-CM

## 2017-06-29 NOTE — Progress Notes (Signed)
Office Visit Note   Patient: Laura Riddle           Date of Birth: Jun 04, 1935           MRN: [id number] Visit Date: 06/29/2017              Requested by: Shon Baton, [address] [place], [place] [address] PCP: Shon Baton, MD   Assessment & Plan: Visit Diagnoses:  1. Pain in left wrist   Healed fracture of left navicular waist. No evidence of a new fracture. Degenerative changes at base of thumb  Plan: Continue with thumb spica splint for 2 weeks for pain control. Work note for limited activity for the next 2 weeks. Return at that point for repeat films if necessary  Follow-Up Instructions: No Follow-up on file.   Orders:  Orders Placed This Encounter  Procedures  . XR Wrist Complete Left   No orders of the defined types were placed in this encounter.     Procedures: No procedures performed   Clinical Data: No additional findings.   Subjective: Chief Complaint  Patient presents with  . Left Wrist - Pain    Laura Riddle is a 82 y o that still presents with L wrist pain.  Laura Riddle is been followed for the nondisplaced fracture of the waist of the left navicular. She is now over 2 months post injury. She was seen last week with films demonstrating healing of the fracture. She fell over the weekend breaking her fall with her left wrist and is concerned that she may have re-created a fracture. She has been wearing the splint and notes that she's been comfortable. She continues to work but is limited when she wears the splint. No new swelling or ecchymosis  HPI  Review of Systems   Objective: Vital Signs: BP (!) 167/70   Pulse 70   Ht 5\' 4"  (1.626 m)   Wt 130 lb (59 kg)   BMI 22.31 kg/m   Physical Exam  Ortho Exam left wrist with some tenderness at the base of the thumb and in the snuffbox. No ecchymosis or obvious swelling. No erythema. Skin intact. Neurovascular exam is intact. No pain about the wrist. Little stiffness with motion of the wrist. No pain  over the radial ulnar or median nerve.  Specialty Comments:  No specialty comments available.  Imaging: Xr Wrist Complete Left  Result Date: 06/29/2017 Films of the left wrist were obtained in 3 projections. There is no evidence of a new fracture. An old fracture of the navicular is not visible. There is no separation of the fragments. There are degenerative changes on both sides of the greater multangular. No evidence of fracture the distal radius or ulna    PMFS History: Patient Active Problem List   Diagnosis Date Noted  . HYPERTENSION 11/10/2009  . COLONIC POLYPS, HYPERPLASTIC 11/09/2009  . HYPOTHYROIDISM 11/09/2009  . HYPERLIPIDEMIA 11/09/2009  . GERD 11/09/2009  . HIATAL HERNIA 11/09/2009  . IBS 11/09/2009   Past Medical History:  Diagnosis Date  . Colon polyps   . GERD (gastroesophageal reflux disease)   . Hiatal hernia   . Hyperlipidemia   . Hypertension   . Hypothyroidism   . IBS (irritable bowel syndrome)     Family History  Problem Relation Age of Onset  . Colon cancer Mother   . Breast cancer Sister   . Liver cancer Mother     Past Surgical History:  Procedure Laterality Date  . ABDOMINAL HYSTERECTOMY    .  APPENDECTOMY    . CATARACT EXTRACTION    . CERVICAL FUSION    . CHOLECYSTECTOMY     Social History   Occupational History  . nurse    Social History Main Topics  . Smoking status: Never Smoker  . Smokeless tobacco: Never Used  . Alcohol use No  . Drug use: No  . Sexual activity: Not on file     Garald Balding, MD   Note - This record has been created using [employer].  Chart creation errors have been sought, but may not always  have been located. Such creation errors do not reflect on  the standard of medical care.

## 2017-07-03 ENCOUNTER — Ambulatory Visit (INDEPENDENT_AMBULATORY_CARE_PROVIDER_SITE_OTHER): Payer: Medicare Other | Admitting: Orthopaedic Surgery

## 2017-07-18 DIAGNOSIS — H52203 Unspecified astigmatism, bilateral: Secondary | ICD-10-CM | POA: Diagnosis not present

## 2017-07-18 DIAGNOSIS — Z961 Presence of intraocular lens: Secondary | ICD-10-CM | POA: Diagnosis not present

## 2017-07-24 ENCOUNTER — Encounter (INDEPENDENT_AMBULATORY_CARE_PROVIDER_SITE_OTHER): Payer: Self-pay | Admitting: Orthopaedic Surgery

## 2017-07-24 ENCOUNTER — Ambulatory Visit (INDEPENDENT_AMBULATORY_CARE_PROVIDER_SITE_OTHER): Payer: Medicare Other | Admitting: Orthopaedic Surgery

## 2017-07-24 VITALS — BP 136/67 | HR 98 | Resp 16 | Ht 67.0 in | Wt 152.0 lb

## 2017-07-24 DIAGNOSIS — M1812 Unilateral primary osteoarthritis of first carpometacarpal joint, left hand: Secondary | ICD-10-CM

## 2017-07-24 NOTE — Progress Notes (Signed)
Office Visit Note   Patient: Laura Riddle           Date of Birth: 10-28-1935           MRN: [id number] Visit Date: 07/24/2017              Requested by: Shon Baton, [address] [place], [place] [address] PCP: Shon Baton, MD   Assessment & Plan: Visit Diagnoses:  1. Primary osteoarthritis of first carpometacarpal joint of left hand   Status post nondisplaced fracture of left carpal navicular with healing. Secondary diagnosis of primary osteoarthritis  Plan: Fracture has healed. Osteoarthritis is minimally symptomatic. Okay to return to work full duty full activity without restrictions  Follow-Up Instructions: Return if symptoms worsen or fail to improve.   Orders:  No orders of the defined types were placed in this encounter.  No orders of the defined types were placed in this encounter.     Procedures: No procedures performed   Clinical Data: No additional findings.   Subjective: Chief Complaint  Patient presents with  . Left Wrist - Follow-up  Laura Riddle is been followed for 2 issues referable to her left wrist. She has a nondisplaced fracture through the waist of the navicular. This appears to have healed. Secondarily she has some arthritis at the base of the thumb. I been restricting her activities at work. Presently she notes she is doing quite well and really not having much pain. Denies numbness or tingling.  HPI  Review of Systems   Objective: Vital Signs: BP 136/67 (BP Location: Right Arm, Patient Position: Sitting, Cuff Size: Normal)   Pulse 98   Resp 16   Ht 5\' 7"  (1.702 m)   Wt 152 lb (68.9 kg)   BMI 23.81 kg/m   Physical Exam  Ortho Exam left wrist with normal range of motion. No pain in the snuffbox. Very minimally positive grind test at the base of the thumb. No redness. Neurovascular exam intact Specialty Comments:  No specialty comments available.  Imaging: No results found.   PMFS History: Patient Active Problem List   Diagnosis Date Noted  . HYPERTENSION 11/10/2009  . COLONIC POLYPS, HYPERPLASTIC 11/09/2009  . HYPOTHYROIDISM 11/09/2009  . HYPERLIPIDEMIA 11/09/2009  . GERD 11/09/2009  . HIATAL HERNIA 11/09/2009  . IBS 11/09/2009   Past Medical History:  Diagnosis Date  . Colon polyps   . GERD (gastroesophageal reflux disease)   . Hiatal hernia   . Hyperlipidemia   . Hypertension   . Hypothyroidism   . IBS (irritable bowel syndrome)     Family History  Problem Relation Age of Onset  . Colon cancer Mother   . Liver cancer Mother   . Breast cancer Sister     Past Surgical History:  Procedure Laterality Date  . ABDOMINAL HYSTERECTOMY    . APPENDECTOMY    . CATARACT EXTRACTION    . CERVICAL FUSION    . CHOLECYSTECTOMY    . WRIST FRACTURE SURGERY     Social History   Occupational History  . nurse    Social History Main Topics  . Smoking status: Never Smoker  . Smokeless tobacco: Never Used  . Alcohol use No  . Drug use: No  . Sexual activity: Not on file     Garald Balding, MD   Note - This record has been created using [employer].  Chart creation errors have been sought, but may not always  have been located. Such creation errors do  not reflect on  the standard of medical care.

## 2017-08-03 DIAGNOSIS — D649 Anemia, unspecified: Secondary | ICD-10-CM | POA: Diagnosis not present

## 2017-08-03 DIAGNOSIS — D509 Iron deficiency anemia, unspecified: Secondary | ICD-10-CM | POA: Diagnosis not present

## 2017-12-21 ENCOUNTER — Other Ambulatory Visit (INDEPENDENT_AMBULATORY_CARE_PROVIDER_SITE_OTHER): Payer: Self-pay | Admitting: Orthopedic Surgery

## 2017-12-21 MED ORDER — DICLOFENAC SODIUM 1 % TD GEL: 2.0000 g | Freq: Four times a day (QID) | TRANSDERMAL | 5 refills | Status: DC

## 2017-12-25 ENCOUNTER — Ambulatory Visit (INDEPENDENT_AMBULATORY_CARE_PROVIDER_SITE_OTHER): Payer: Medicare Other | Admitting: Orthopaedic Surgery

## 2017-12-25 ENCOUNTER — Ambulatory Visit (INDEPENDENT_AMBULATORY_CARE_PROVIDER_SITE_OTHER): Payer: Medicare Other

## 2017-12-25 ENCOUNTER — Encounter (INDEPENDENT_AMBULATORY_CARE_PROVIDER_SITE_OTHER): Payer: Self-pay | Admitting: Orthopaedic Surgery

## 2017-12-25 VITALS — Resp 14 | Ht 67.0 in | Wt 165.0 lb

## 2017-12-25 DIAGNOSIS — G8929 Other chronic pain: Secondary | ICD-10-CM

## 2017-12-25 DIAGNOSIS — M25511 Pain in right shoulder: Secondary | ICD-10-CM

## 2017-12-25 MED ORDER — METHYLPREDNISOLONE ACETATE 40 MG/ML IJ SUSP
80.0000 mg | INTRAMUSCULAR | Status: AC | PRN
  Administered 2017-12-25: 80 mg

## 2017-12-25 MED ORDER — LIDOCAINE HCL 2 % IJ SOLN
2.0000 mL | INTRAMUSCULAR | Status: AC | PRN
  Administered 2017-12-25: 2 mL

## 2017-12-25 MED ORDER — BUPIVACAINE HCL 0.5 % IJ SOLN
2.0000 mL | INTRAMUSCULAR | Status: AC | PRN
  Administered 2017-12-25: 2 mL via INTRA_ARTICULAR

## 2017-12-25 NOTE — Progress Notes (Signed)
Office Visit Note   Patient: ORLANDO THALMANN           Date of Birth: Sep 19, 1935           MRN: [id number] Visit Date: 12/25/2017              Requested by: Shon Baton, [place] [place], [place] [address] PCP: Shon Baton, MD   Assessment & Plan: Visit Diagnoses:  1. Chronic right shoulder pain     Plan: Impingement syndrome right shoulder. We'll try a subacromial cortisone injection and monitor her response  Follow-Up Instructions: No Follow-up on file.   Orders:  Orders Placed This Encounter  Procedures  . XR Shoulder Right   No orders of the defined types were placed in this encounter.     Procedures: Large Joint Inj: R subacromial bursa on 12/25/2017 11:52 AM Indications: pain and diagnostic evaluation Details: 25 G 1.5 in needle, anterolateral approach  Arthrogram: No  Medications: 2 mL lidocaine 2 %; 2 mL bupivacaine 0.5 %; 80 mg methylPREDNISolone acetate 40 MG/ML Consent was given by the patient. Immediately prior to procedure a time out was called to verify the correct patient, procedure, equipment, support staff and site/side marked as required. Patient was prepped and draped in the usual sterile fashion.       Clinical Data: No additional findings.   Subjective: Chief Complaint  Patient presents with  . Right Shoulder - Pain    Mrs. Comunale is a 83 y o here today for chronic right shoulder pain x months after she received the Shingles Vaccine  Insidious onset of right shoulder pain after receiving a  shingles injection December 27. No injury or trauma. Pain on overhead motion right shoulder. Some pain returning onto her side. No numbness or tingling. No pain into the biceps. No numbness or tingling. Denies any problem the cervical spine.  HPI  Review of Systems  Constitutional: Negative for chills, fatigue and fever.  Eyes: Negative for itching.  Respiratory: Negative for chest tightness and shortness of breath.   Cardiovascular:  Negative for chest pain, palpitations and leg swelling.  Gastrointestinal: Negative for blood in stool, constipation and diarrhea.  Endocrine: Negative for polyuria.  Genitourinary: Negative for dysuria.  Musculoskeletal: Negative for back pain, joint swelling, neck pain and neck stiffness.  Allergic/Immunologic: Negative for immunocompromised state.  Neurological: Negative for dizziness and numbness.  Hematological: Does not bruise/bleed easily.  Psychiatric/Behavioral: The patient is not nervous/anxious.      Objective: Vital Signs: There were no vitals taken for this visit.  Physical Exam  Ortho Exam awake alert and oriented 3. Comfortable sitting. Some 10 tenderness along the anterior subacromial region neurovascular exam intact. Positive only positive impingement testing. Negative empty can. Good grip and release. Neurologically intact. No cervical spine pain  Specialty Comments:  No specialty comments available.  Imaging: No results found.   PMFS History: Patient Active Problem List   Diagnosis Date Noted  . HYPERTENSION 11/10/2009  . COLONIC POLYPS, HYPERPLASTIC 11/09/2009  . HYPOTHYROIDISM 11/09/2009  . HYPERLIPIDEMIA 11/09/2009  . GERD 11/09/2009  . HIATAL HERNIA 11/09/2009  . IBS 11/09/2009   Past Medical History:  Diagnosis Date  . Colon polyps   . GERD (gastroesophageal reflux disease)   . Hiatal hernia   . Hyperlipidemia   . Hypertension   . Hypothyroidism   . IBS (irritable bowel syndrome)     Family History  Problem Relation Age of Onset  . Colon cancer Mother   .  Liver cancer Mother   . Breast cancer Sister     Past Surgical History:  Procedure Laterality Date  . ABDOMINAL HYSTERECTOMY    . APPENDECTOMY    . CATARACT EXTRACTION    . CERVICAL FUSION    . CHOLECYSTECTOMY    . WRIST FRACTURE SURGERY     Social History   Occupational History  . Occupation: nurse  Tobacco Use  . Smoking status: Never Smoker  . Smokeless tobacco: Never  Used  Substance and Sexual Activity  . Alcohol use: No    Alcohol/week: 0.0 oz  . Drug use: No  . Sexual activity: Not on file

## 2018-01-03 DIAGNOSIS — M859 Disorder of bone density and structure, unspecified: Secondary | ICD-10-CM | POA: Diagnosis not present

## 2018-01-10 DIAGNOSIS — Z803 Family history of malignant neoplasm of breast: Secondary | ICD-10-CM | POA: Diagnosis not present

## 2018-01-10 DIAGNOSIS — Z1231 Encounter for screening mammogram for malignant neoplasm of breast: Secondary | ICD-10-CM | POA: Diagnosis not present

## 2018-02-16 DIAGNOSIS — H04122 Dry eye syndrome of left lacrimal gland: Secondary | ICD-10-CM | POA: Diagnosis not present

## 2018-03-12 DIAGNOSIS — H43811 Vitreous degeneration, right eye: Secondary | ICD-10-CM | POA: Diagnosis not present

## 2018-05-07 DIAGNOSIS — H00012 Hordeolum externum right lower eyelid: Secondary | ICD-10-CM | POA: Diagnosis not present

## 2018-06-07 ENCOUNTER — Encounter (INDEPENDENT_AMBULATORY_CARE_PROVIDER_SITE_OTHER): Payer: Self-pay | Admitting: Orthopaedic Surgery

## 2018-06-07 ENCOUNTER — Ambulatory Visit (INDEPENDENT_AMBULATORY_CARE_PROVIDER_SITE_OTHER): Payer: Medicare Other | Admitting: Orthopaedic Surgery

## 2018-06-07 VITALS — BP 145/63 | HR 88 | Ht 67.0 in | Wt 160.0 lb

## 2018-06-07 DIAGNOSIS — M722 Plantar fascial fibromatosis: Secondary | ICD-10-CM

## 2018-06-07 MED ORDER — BUPIVACAINE HCL 0.5 % IJ SOLN
1.0000 mL | INTRAMUSCULAR | Status: AC | PRN
  Administered 2018-06-07: 1 mL

## 2018-06-07 MED ORDER — METHYLPREDNISOLONE ACETATE 40 MG/ML IJ SUSP
40.0000 mg | INTRAMUSCULAR | Status: AC | PRN
Start: 2018-06-07 — End: 2018-06-07
  Administered 2018-06-07: 40 mg

## 2018-06-07 MED ORDER — LIDOCAINE HCL 1 % IJ SOLN
1.0000 mL | INTRAMUSCULAR | Status: AC | PRN
  Administered 2018-06-07: 1 mL

## 2018-06-07 NOTE — Progress Notes (Signed)
Office Visit Note   Patient: Laura Riddle           Date of Birth: Apr 06, 1935           MRN: [id number] Visit Date: 06/07/2018              Requested by: Shon Baton, [place], [place] [address] PCP: Shon Baton, MD   Assessment & Plan: Visit Diagnoses:  1. Plantar fasciitis of right foot     Plan: Recurrent symptoms of plantar fasciitis right heel.  Will inject with cortisone.  Not sure why she is experiencing some decreased sensibility along the lateral 3 toes of her right foot.  Monitor response after cortisone injection  Follow-Up Instructions: Return if symptoms worsen or fail to improve.   Orders:  Orders Placed This Encounter  Procedures  . Foot Inj   No orders of the defined types were placed in this encounter.     Procedures: Foot Inj Date/Time: 06/07/2018 4:36 PM Performed by: Garald Balding, MD Authorized by: Garald Balding, MD   Condition: Plantar Fasciitis   Location: right plantar fascia muscle   Medications:  1 mL lidocaine 1 %; 1 mL bupivacaine 0.5 %; 40 mg methylPREDNISolone acetate 40 MG/ML     Clinical Data: No additional findings.   Subjective: Chief Complaint  Patient presents with  . Follow-up    R HEEL PAIN POSSIBLE INJECTION Last injection 01/27/17  Carrina has been seen in the past for plantar fasciitis right heel.  Has responded nicely to local cortisone injections.  She is recently had some recurrence of the pain and wishes to have another injection.  She is also been experiencing some decreased sensibility in the lateral 2 toes of the same foot without injury or trauma.  HPI  Review of Systems  Constitutional: Negative for fatigue and fever.  HENT: Negative for ear pain.   Eyes: Negative for pain.  Respiratory: Negative for cough and shortness of breath.   Cardiovascular: Negative for leg swelling.  Gastrointestinal: Negative for constipation and diarrhea.  Genitourinary: Negative for difficulty  urinating.  Musculoskeletal: Negative for back pain and neck pain.  Skin: Negative for rash.  Allergic/Immunologic: Negative for food allergies.  Neurological: Positive for weakness and numbness.  Hematological: Bruises/bleeds easily.  Psychiatric/Behavioral: Negative for sleep disturbance.     Objective: Vital Signs: BP (!) 145/63 (BP Location: Left Arm, Patient Position: Sitting, Cuff Size: Normal)   Pulse 88   Ht 5\' 7"  (1.702 m)   Wt 160 lb (72.6 kg)   BMI 25.06 kg/m   Physical Exam  Constitutional: She is oriented to person, place, and time. She appears well-developed and well-nourished.  HENT:  Mouth/Throat: Oropharynx is clear and moist.  Eyes: Pupils are equal, round, and reactive to light. EOM are normal.  Pulmonary/Chest: Effort normal.  Neurological: She is alert and oriented to person, place, and time.  Skin: Skin is warm and dry.  Psychiatric: She has a normal mood and affect. Her behavior is normal.    Ortho Exam awake alert and oriented x3.  Comfortable sitting.  Local tenderness over the plantar fascial attachment of the right os calcis.  No skin changes.  No Tinel's.  No posterior heel pain.  Foot was not swollen.  Good pulses.  Some decrease sensibility subjectively to the lateral 3 toes.  No pain over the peroneal nerve along its entire course  Specialty Comments:  No specialty comments available.  Imaging: No results found.  PMFS History: Patient Active Problem List   Diagnosis Date Noted  . HYPERTENSION 11/10/2009  . COLONIC POLYPS, HYPERPLASTIC 11/09/2009  . HYPOTHYROIDISM 11/09/2009  . HYPERLIPIDEMIA 11/09/2009  . GERD 11/09/2009  . HIATAL HERNIA 11/09/2009  . IBS 11/09/2009   Past Medical History:  Diagnosis Date  . Colon polyps   . GERD (gastroesophageal reflux disease)   . Hiatal hernia   . Hyperlipidemia   . Hypertension   . Hypothyroidism   . IBS (irritable bowel syndrome)     Family History  Problem Relation Age of Onset  .  Colon cancer Mother   . Liver cancer Mother   . Breast cancer Sister     Past Surgical History:  Procedure Laterality Date  . ABDOMINAL HYSTERECTOMY    . APPENDECTOMY    . CATARACT EXTRACTION    . CERVICAL FUSION    . CHOLECYSTECTOMY    . WRIST FRACTURE SURGERY     Social History   Occupational History  . Occupation: nurse  Tobacco Use  . Smoking status: Never Smoker  . Smokeless tobacco: Never Used  Substance and Sexual Activity  . Alcohol use: No    Alcohol/week: 0.0 oz  . Drug use: No  . Sexual activity: Not on file

## 2018-06-12 DIAGNOSIS — L820 Inflamed seborrheic keratosis: Secondary | ICD-10-CM | POA: Diagnosis not present

## 2018-06-12 DIAGNOSIS — R7309 Other abnormal glucose: Secondary | ICD-10-CM | POA: Diagnosis not present

## 2018-06-12 DIAGNOSIS — L218 Other seborrheic dermatitis: Secondary | ICD-10-CM | POA: Diagnosis not present

## 2018-06-12 DIAGNOSIS — M859 Disorder of bone density and structure, unspecified: Secondary | ICD-10-CM | POA: Diagnosis not present

## 2018-06-12 DIAGNOSIS — L821 Other seborrheic keratosis: Secondary | ICD-10-CM | POA: Diagnosis not present

## 2018-06-12 DIAGNOSIS — I10 Essential (primary) hypertension: Secondary | ICD-10-CM | POA: Diagnosis not present

## 2018-06-12 DIAGNOSIS — E7849 Other hyperlipidemia: Secondary | ICD-10-CM | POA: Diagnosis not present

## 2018-06-12 DIAGNOSIS — E038 Other specified hypothyroidism: Secondary | ICD-10-CM | POA: Diagnosis not present

## 2018-06-12 DIAGNOSIS — R82998 Other abnormal findings in urine: Secondary | ICD-10-CM | POA: Diagnosis not present

## 2018-07-25 ENCOUNTER — Telehealth (INDEPENDENT_AMBULATORY_CARE_PROVIDER_SITE_OTHER): Payer: Self-pay | Admitting: *Deleted

## 2018-07-26 ENCOUNTER — Encounter (INDEPENDENT_AMBULATORY_CARE_PROVIDER_SITE_OTHER): Payer: Self-pay | Admitting: Orthopaedic Surgery

## 2018-07-26 ENCOUNTER — Ambulatory Visit (INDEPENDENT_AMBULATORY_CARE_PROVIDER_SITE_OTHER): Payer: Medicare Other | Admitting: Orthopaedic Surgery

## 2018-07-26 VITALS — BP 149/69 | HR 85 | Resp 16 | Ht 67.0 in | Wt 160.0 lb

## 2018-07-26 DIAGNOSIS — M79671 Pain in right foot: Secondary | ICD-10-CM | POA: Diagnosis not present

## 2018-07-26 MED ORDER — LIDOCAINE HCL 1 % IJ SOLN
1.0000 mL | INTRAMUSCULAR | Status: AC | PRN
  Administered 2018-07-26: 1 mL

## 2018-07-26 MED ORDER — BUPIVACAINE HCL 0.5 % IJ SOLN
1.0000 mL | INTRAMUSCULAR | Status: AC | PRN
  Administered 2018-07-26: 1 mL

## 2018-07-26 MED ORDER — METHYLPREDNISOLONE ACETATE 40 MG/ML IJ SUSP
40.0000 mg | INTRAMUSCULAR | Status: AC | PRN
  Administered 2018-07-26: 40 mg

## 2018-07-26 NOTE — Progress Notes (Deleted)
   Office Visit Note   Patient: Laura Riddle           Date of Birth: 02-Aug-1935           MRN: [id number] Visit Date: 07/26/2018              Requested by: Shon Baton, [address] [place], [place] [address] PCP: Shon Baton, MD   Assessment & Plan: Visit Diagnoses:  1. Right foot pain     Plan: ***  Follow-Up Instructions: Return if symptoms worsen or fail to improve.   Orders:  No orders of the defined types were placed in this encounter.  No orders of the defined types were placed in this encounter.     Procedures: No procedures performed   Clinical Data: No additional findings.   Subjective: Chief Complaint  Patient presents with  . Foot Pain    Right heel pain    HPI  Review of Systems  Constitutional: Positive for activity change.  HENT: Negative for trouble swallowing.   Eyes: Negative for pain.  Respiratory: Negative for shortness of breath.   Cardiovascular: Negative for leg swelling.  Gastrointestinal: Negative for constipation.  Endocrine: Negative for cold intolerance.  Genitourinary: Negative for difficulty urinating.  Musculoskeletal: Positive for joint swelling.  Skin: Negative for rash.  Allergic/Immunologic: Negative for food allergies.  Neurological: Negative for weakness.  Hematological: Does not bruise/bleed easily.  Psychiatric/Behavioral: Positive for sleep disturbance.     Objective: Vital Signs: BP (!) 149/69 (BP Location: Right Arm, Patient Position: Sitting, Cuff Size: Normal)   Pulse 85   Resp 16   Ht 5\' 7"  (1.702 m)   Wt 160 lb (72.6 kg)   BMI 25.06 kg/m   Physical Exam  Ortho Exam  Specialty Comments:  No specialty comments available.  Imaging: No results found.   PMFS History: Patient Active Problem List   Diagnosis Date Noted  . HYPERTENSION 11/10/2009  . COLONIC POLYPS, HYPERPLASTIC 11/09/2009  . HYPOTHYROIDISM 11/09/2009  . HYPERLIPIDEMIA 11/09/2009  . GERD 11/09/2009  . HIATAL HERNIA  11/09/2009  . IBS 11/09/2009   Past Medical History:  Diagnosis Date  . Colon polyps   . GERD (gastroesophageal reflux disease)   . Hiatal hernia   . Hyperlipidemia   . Hypertension   . Hypothyroidism   . IBS (irritable bowel syndrome)     Family History  Problem Relation Age of Onset  . Colon cancer Mother   . Liver cancer Mother   . Breast cancer Sister     Past Surgical History:  Procedure Laterality Date  . ABDOMINAL HYSTERECTOMY    . APPENDECTOMY    . BACK SURGERY    . BACK SURGERY     L5-S1  . CATARACT EXTRACTION    . CERVICAL FUSION    . CHOLECYSTECTOMY    . WRIST FRACTURE SURGERY     Social History   Occupational History  . Occupation: nurse  Tobacco Use  . Smoking status: Never Smoker  . Smokeless tobacco: Never Used  Substance and Sexual Activity  . Alcohol use: No    Alcohol/week: 0.0 standard drinks  . Drug use: No  . Sexual activity: Not on file

## 2018-07-26 NOTE — Telephone Encounter (Signed)
Called pt and left vm#1.

## 2018-07-26 NOTE — Telephone Encounter (Signed)
Called pt and left vm #2.  

## 2018-07-26 NOTE — Progress Notes (Signed)
Office Visit Note   Patient: Laura Riddle           Date of Birth: 1934-12-14           MRN: [id number] Visit Date: 07/26/2018              Requested by: Shon Baton, [place] [place], [place] [address] PCP: Shon Baton, MD   Assessment & Plan: Visit Diagnoses:  1. Right foot pain     Plan: Recurrent plantar fasciitis right foot.  Repeat cortisone injection.  Good padded shoes.  Ankle support for lateral ankle sprain  Follow-Up Instructions: Return if symptoms worsen or fail to improve.   Orders:  No orders of the defined types were placed in this encounter.  No orders of the defined types were placed in this encounter.     Procedures: Foot Inj Date/Time: 07/26/2018 5:06 PM Performed by: Garald Balding, MD Authorized by: Garald Balding, MD   Condition: Plantar Fasciitis   Location: right plantar fascia muscle   Medications:  1 mL lidocaine 1 %; 1 mL bupivacaine 0.5 %; 40 mg methylPREDNISolone acetate 40 MG/ML     Clinical Data: No additional findings.   Subjective: No chief complaint on file. Recurrent pain plantar aspect right heel consistent with plantar fasciitis.  Really having a difficult time with weightbearing despite good comfortable shoes and inserts.  Also having some pain along the lateral aspect of her ankle it could be consistent with an ankle sprain.  She will wear good ankle support  HPI  Review of Systems   Objective: Vital Signs: BP (!) 149/69 (BP Location: Right Arm, Patient Position: Sitting, Cuff Size: Normal)   Pulse 85   Resp 16   Ht 5\' 7"  (1.702 m)   Wt 160 lb (72.6 kg)   BMI 25.06 kg/m   Physical Exam  Ortho Exam right foot exam with tenderness directly over the plantar fascial insertion on the os calcis.  No skin change.  Neurovascular exam intact.  No masses.  No pain directly over the posterior aspect of the os calcis.  Mild swelling of lateral ankle.  No specific injury but is on her feet all day long  sprain  Specialty Comments:  No specialty comments available.  Imaging: No results found.   PMFS History: Patient Active Problem List   Diagnosis Date Noted  . HYPERTENSION 11/10/2009  . COLONIC POLYPS, HYPERPLASTIC 11/09/2009  . HYPOTHYROIDISM 11/09/2009  . HYPERLIPIDEMIA 11/09/2009  . GERD 11/09/2009  . HIATAL HERNIA 11/09/2009  . IBS 11/09/2009   Past Medical History:  Diagnosis Date  . Colon polyps   . GERD (gastroesophageal reflux disease)   . Hiatal hernia   . Hyperlipidemia   . Hypertension   . Hypothyroidism   . IBS (irritable bowel syndrome)     Family History  Problem Relation Age of Onset  . Colon cancer Mother   . Liver cancer Mother   . Breast cancer Sister     Past Surgical History:  Procedure Laterality Date  . ABDOMINAL HYSTERECTOMY    . APPENDECTOMY    . BACK SURGERY    . BACK SURGERY     L5-S1  . CATARACT EXTRACTION    . CERVICAL FUSION    . CHOLECYSTECTOMY    . WRIST FRACTURE SURGERY     Social History   Occupational History  . Occupation: nurse  Tobacco Use  . Smoking status: Never Smoker  . Smokeless tobacco: Never Used  Substance  and Sexual Activity  . Alcohol use: No    Alcohol/week: 0.0 standard drinks  . Drug use: No  . Sexual activity: Not on file     Garald Balding, MD   Note - This record has been created using [employer].  Chart creation errors have been sought, but may not always  have been located. Such creation errors do not reflect on  the standard of medical care.

## 2018-07-26 NOTE — Telephone Encounter (Signed)
[place] for office visit with possible greater trochanter injection

## 2018-07-31 NOTE — Telephone Encounter (Signed)
Scheduled for 9/17

## 2018-07-31 NOTE — Telephone Encounter (Signed)
Called pt and left vm #3.  

## 2018-08-06 DIAGNOSIS — H01005 Unspecified blepharitis left lower eyelid: Secondary | ICD-10-CM | POA: Diagnosis not present

## 2018-08-06 DIAGNOSIS — H52203 Unspecified astigmatism, bilateral: Secondary | ICD-10-CM | POA: Diagnosis not present

## 2018-08-06 DIAGNOSIS — Z961 Presence of intraocular lens: Secondary | ICD-10-CM | POA: Diagnosis not present

## 2018-08-06 DIAGNOSIS — H01002 Unspecified blepharitis right lower eyelid: Secondary | ICD-10-CM | POA: Diagnosis not present

## 2018-08-14 ENCOUNTER — Ambulatory Visit (INDEPENDENT_AMBULATORY_CARE_PROVIDER_SITE_OTHER): Payer: Medicare Other

## 2018-08-14 ENCOUNTER — Other Ambulatory Visit (INDEPENDENT_AMBULATORY_CARE_PROVIDER_SITE_OTHER): Payer: Self-pay | Admitting: Radiology

## 2018-08-14 ENCOUNTER — Encounter (INDEPENDENT_AMBULATORY_CARE_PROVIDER_SITE_OTHER): Payer: Self-pay | Admitting: Orthopedic Surgery

## 2018-08-14 ENCOUNTER — Ambulatory Visit (INDEPENDENT_AMBULATORY_CARE_PROVIDER_SITE_OTHER): Payer: Medicare Other | Admitting: Physical Medicine and Rehabilitation

## 2018-08-14 ENCOUNTER — Ambulatory Visit (INDEPENDENT_AMBULATORY_CARE_PROVIDER_SITE_OTHER): Payer: Medicare Other | Admitting: Orthopedic Surgery

## 2018-08-14 VITALS — BP 147/68 | HR 77 | Ht 67.0 in | Wt 160.0 lb

## 2018-08-14 DIAGNOSIS — M25571 Pain in right ankle and joints of right foot: Secondary | ICD-10-CM

## 2018-08-14 DIAGNOSIS — M25572 Pain in left ankle and joints of left foot: Secondary | ICD-10-CM

## 2018-08-14 DIAGNOSIS — M722 Plantar fascial fibromatosis: Secondary | ICD-10-CM

## 2018-08-14 NOTE — Progress Notes (Signed)
Office Visit Note   Patient: Laura Riddle           Date of Birth: 26-May-1935           MRN: [id number] Visit Date: 08/14/2018              Requested by: Shon Baton, [address] [place], [place] [address] PCP: Shon Baton, MD   Assessment & Plan: Visit Diagnoses:  1. Pain in left ankle and joints of left foot   2. Pain in right ankle and joints of right foot   3. Plantar fasciitis of right foot     Plan:  MRI of right foot to delineate further treatment plans.  Follow-Up Instructions: No follow-ups on file.   Orders:  Orders Placed This Encounter  Procedures  . XR Foot Complete Right  . XR Ankle Complete Right   No orders of the defined types were placed in this encounter.     Procedures: No procedures performed   Clinical Data: No additional findings.   Subjective: Chief Complaint  Patient presents with  . Follow-up    R FOOT PLANTAR FASCITIS, 07/26/18 HAD INJECTION AND MADE IT WORSE    HPI  Laura Riddle is seen today because of continued pain in the plantar aspect of her foot as well as along the lateral aspect of the foot.  She has had 2 corticosteroid injections to the plantar fascial area one on July 11 as well as August 29.  She states the injection on 29th had no benefit at all.  Continues to have pain at the plantar aspect of the heel.  She thinks she is walking differently also.  She is using a arch support as well as the gel pad for plantar fascial pain.  These have not been that beneficial.  She comes in today requesting reevaluation.   Review of Systems  Constitutional: Negative for fatigue and fever.  HENT: Negative for ear pain.   Eyes: Negative for pain.  Respiratory: Negative for cough and shortness of breath.   Cardiovascular: Negative for leg swelling.  Gastrointestinal: Negative for constipation.  Genitourinary: Negative for difficulty urinating.  Musculoskeletal: Negative for back pain and neck pain.  Skin: Negative for rash.    Neurological: Positive for weakness. Negative for numbness.  Psychiatric/Behavioral: Positive for sleep disturbance.     Objective: Vital Signs: BP (!) 147/68 (BP Location: Left Arm, Patient Position: Sitting, Cuff Size: Normal)   Pulse 77   Ht 5\' 7"  (1.702 m)   Wt 160 lb (72.6 kg)   BMI 25.06 kg/m   Physical Exam  Constitutional: She is oriented to person, place, and time. She appears well-developed and well-nourished.  HENT:  Mouth/Throat: Oropharynx is clear and moist.  Eyes: Pupils are equal, round, and reactive to light. EOM are normal.  Pulmonary/Chest: Effort normal.  Neurological: She is alert and oriented to person, place, and time.  Skin: Skin is warm and dry.  Psychiatric: She has a normal mood and affect. Her behavior is normal.    Ortho Exam  Today she has tenderness to palpation of the plantar fascia insertion at the calcaneus.  She also has some tenderness to palpation in the joint line across the midfoot.  She is also having pain at the lateral aspect of the foot at the fifth metatarsal and cuboid.  Specialty Comments:  No specialty comments available.  Imaging: Xr Ankle Complete Right  Result Date: 08/14/2018 Three-view ankle films reveals degenerative changes at the tib talar  joint as well as the tail of medial malleolar area.  May have some impingement at the dorsal talus with the tibia.  Very high arch noted.  Xr Foot Complete Right  Result Date: 08/14/2018 [facility].Three-view x-ray of the right foot does reveal degenerative joint disease at the first MTP as well as the cuboid and the fifth metatarsal proximally.  Some narrowing medially navicular and first cuneiform.  She has some dorsal spurring at the talus.  Also has some diffuse narrowing of TMT's across the foot.    PMFS History: Current Outpatient Medications  Medication Sig Dispense Refill  . amitriptyline (ELAVIL) 50 MG tablet Take 50 mg by mouth at bedtime.    . Ascorbic Acid (VITAMIN C) 1000  MG tablet Take 1,000 mg by mouth daily.    Marland Kitchen atorvastatin (LIPITOR) 20 MG tablet Take 20 mg by mouth daily.    . Cholecalciferol (VITAMIN D3) 2000 UNITS TABS Take 2 tablets by mouth daily.    . clobetasol (TEMOVATE) 0.05 % external solution APPLY  1 MILLILITER TO AFFECTED AREA ON THE SCALP QD FOR 10 DAYS  3  . diazepam (VALIUM) 5 MG tablet TK 1/2 TO 1 T PO Q 12 H PRF HA  0  . diclofenac sodium (VOLTAREN) 1 % GEL Apply 2-4 g topically 4 (four) times daily. 5 Tube 5  . HYDROcodone-acetaminophen (NORCO) 10-325 MG tablet TK 1 TO 2 TS PO Q 6 H PRN  0  . levothyroxine (SYNTHROID, LEVOTHROID) 112 MCG tablet Take 112 mcg by mouth daily before breakfast.    . loratadine (CLARITIN) 10 MG tablet Take 10 mg by mouth daily.    Marland Kitchen losartan (COZAAR) 100 MG tablet Take 100 mg by mouth daily.    . Multiple Vitamin (MULTIVITAMIN) tablet Take 1 tablet by mouth daily.    Marland Kitchen olmesartan (BENICAR) 20 MG tablet Take 20 mg by mouth daily.    Marland Kitchen omeprazole (PRILOSEC) 20 MG capsule Take 20 mg by mouth daily.    Marland Kitchen zolpidem (AMBIEN) 10 MG tablet Take 10 mg by mouth at bedtime as needed for sleep.    . methocarbamol (ROBAXIN) 500 MG tablet Take 1 tablet (500 mg total) by mouth 2 (two) times daily as needed for muscle spasms. (Patient not taking: Reported on 07/26/2018) 30 tablet 0   No current facility-administered medications for this visit.     Patient Active Problem List   Diagnosis Date Noted  . HYPERTENSION 11/10/2009  . COLONIC POLYPS, HYPERPLASTIC 11/09/2009  . HYPOTHYROIDISM 11/09/2009  . HYPERLIPIDEMIA 11/09/2009  . GERD 11/09/2009  . HIATAL HERNIA 11/09/2009  . IBS 11/09/2009   Past Medical History:  Diagnosis Date  . Colon polyps   . GERD (gastroesophageal reflux disease)   . Hiatal hernia   . Hyperlipidemia   . Hypertension   . Hypothyroidism   . IBS (irritable bowel syndrome)     Family History  Problem Relation Age of Onset  . Colon cancer Mother   . Liver cancer Mother   . Breast cancer  Sister     Past Surgical History:  Procedure Laterality Date  . ABDOMINAL HYSTERECTOMY    . APPENDECTOMY    . BACK SURGERY    . BACK SURGERY     L5-S1  . CATARACT EXTRACTION    . CERVICAL FUSION    . CHOLECYSTECTOMY    . WRIST FRACTURE SURGERY     Social History   Occupational History  . Occupation: nurse  Tobacco Use  . Smoking status:  Never Smoker  . Smokeless tobacco: Never Used  Substance and Sexual Activity  . Alcohol use: No    Alcohol/week: 0.0 standard drinks  . Drug use: No  . Sexual activity: Not on file

## 2018-08-19 ENCOUNTER — Ambulatory Visit
Admission: RE | Admit: 2018-08-19 | Discharge: 2018-08-19 | Disposition: A | Discharge status: Home / Self Care | Payer: Medicare Other | Source: Ambulatory Visit | Attending: Orthopedic Surgery | Admitting: Orthopedic Surgery

## 2018-08-19 DIAGNOSIS — M722 Plantar fascial fibromatosis: Secondary | ICD-10-CM | POA: Diagnosis not present

## 2018-08-19 DIAGNOSIS — M25571 Pain in right ankle and joints of right foot: Secondary | ICD-10-CM

## 2018-08-29 ENCOUNTER — Ambulatory Visit (INDEPENDENT_AMBULATORY_CARE_PROVIDER_SITE_OTHER): Payer: Medicare Other | Admitting: Orthopedic Surgery

## 2018-08-29 ENCOUNTER — Encounter: Payer: Self-pay | Admitting: *Deleted

## 2018-08-29 ENCOUNTER — Encounter (INDEPENDENT_AMBULATORY_CARE_PROVIDER_SITE_OTHER): Payer: Self-pay | Admitting: Orthopedic Surgery

## 2018-08-29 VITALS — BP 140/60 | Resp 16 | Ht 67.0 in | Wt 160.0 lb

## 2018-08-29 DIAGNOSIS — M25571 Pain in right ankle and joints of right foot: Secondary | ICD-10-CM

## 2018-08-29 DIAGNOSIS — M629 Disorder of muscle, unspecified: Secondary | ICD-10-CM

## 2018-08-29 NOTE — Progress Notes (Signed)
Office Visit Note   Patient: Laura Riddle           Date of Birth: 30-Apr-1935           MRN: [id number] Visit Date: 08/29/2018              Requested by: Shon Baton, [place] [place], [place] [address] PCP: Shon Baton, MD   Assessment & Plan: Visit Diagnoses:  1. Nontraumatic tear of plantar fascia    2.     High-grade partial tear medial plantar fascia right foot  Plan:  #1: At this time I believe some immobilization would be of benefit for her since she is not interested in any surgical intervention.  Also the fact that this is a high-grade partial tear to be best to allow this to heal.  She could be a candidate for a plantar fascial release if this is not beneficial. #2: Sent her over to [employer] for an equalizer boot #3: Out of work Thursday Friday and Monday #4: We will see how she does with the boot and if she has good progress with pain relief then no further intervention.  However if not then we may need to consider other medical or surgical treatment.  Certainly a plantar fascial release may be of benefit.  Follow-Up Instructions: Return if symptoms worsen or fail to improve.   Face-to-face time spent with patient was greater than 50 minutes.  Greater than 50% of the time was spent in counseling and coordination of care.  We discussed casting, equalizer boot, injections, and surgical intervention if indicated.  Orders:  No orders of the defined types were placed in this encounter.  No orders of the defined types were placed in this encounter.     Procedures: No procedures performed   Clinical Data: No additional findings.   Subjective: Chief Complaint  Patient presents with  . Right Foot - Pain    HPI  Laura Riddle is seen today for follow-up because of continued pain in the plantar aspect of her foot as well as along the lateral aspect of the foot.  She has had 2 corticosteroid injections to the plantar fascial area one on July 11 as well as August  29.  She states the injection on 29th had no benefit at all.  Continued to have pain at the plantar aspect of the heel. She is using a arch support  plantar fascial pain.  These had not been that beneficial.  An MRI scan was ordered of the right foot and she returns today for review of the MRI.    Review of Systems  Constitutional: Negative for fatigue and fever.  HENT: Negative for ear pain.   Eyes: Negative for pain.  Respiratory: Negative for cough and shortness of breath.   Cardiovascular: Negative for leg swelling.  Gastrointestinal: Negative for constipation.  Genitourinary: Negative for difficulty urinating.  Musculoskeletal: Negative for back pain and neck pain.  Skin: Negative for rash.  Neurological: Positive for weakness. Negative for numbness.  Psychiatric/Behavioral: Positive for sleep disturbance.     Objective: Vital Signs: There were no vitals taken for this visit.  Physical Exam  Constitutional: She is oriented to person, place, and time. She appears well-developed and well-nourished.  HENT:  Mouth/Throat: Oropharynx is clear and moist.  Eyes: Pupils are equal, round, and reactive to light. EOM are normal.  Pulmonary/Chest: Effort normal.  Neurological: She is alert and oriented to person, place, and time.  Skin: Skin is warm  and dry.  Psychiatric: She has a normal mood and affect. Her behavior is normal.    Ortho Exam  She continues to have pain at the plantar fascial insertion at the calcaneus.  Not much pain with medial and lateral squeeze test.  Certainly is tender to palpation over the insertion of the plantar fascia.  Specialty Comments:  No specialty comments available.  Imaging: Mr Foot Right W/o Contrast  Result Date: 08/20/2018 CLINICAL DATA:  Right hindfoot pain for 3 months.  Plantar pain. EXAM: MRI OF THE RIGHT FOREFOOT WITHOUT CONTRAST TECHNIQUE: Multiplanar, multisequence MR imaging of the right foot was performed. No intravenous contrast was  administered. COMPARISON:  None. FINDINGS: TENDONS Peroneal: Peroneal longus tendon intact. Mild tendinosis of the peroneus brevis with a short-segment longitudinal split tear. Posteromedial: Posterior tibial tendon intact. Flexor hallucis longus tendon intact. Flexor digitorum longus tendon intact. Anterior: Tibialis anterior tendon intact. Extensor hallucis longus tendon intact Extensor digitorum longus tendon intact. Moderate amount of fluid in the extensor digitorum longus tendon sheath as can be seen with tenosynovitis. Achilles:  Intact. Plantar Fascia: Severe thickening and increased signal in the medial band of the plantar fascia adjacent to the calcaneal insertion with a high-grade partial-thickness tear. LIGAMENTS Lateral: Anterior talofibular ligament intact. Calcaneofibular ligament intact. Posterior talofibular ligament intact. Anterior and posterior tibiofibular ligaments intact. Medial: Deltoid ligament intact. Spring ligament intact. CARTILAGE Ankle Joint: No joint effusion. Normal ankle mortise. No chondral defect. Subtalar Joints/Sinus Tarsi: Normal subtalar joints. No subtalar joint effusion. Normal sinus tarsi. Bones: No marrow signal abnormality. No fracture or dislocation. Relative pes cavus. Soft Tissue: No fluid collection or hematoma.  Muscles are normal. IMPRESSION: 1. Severe plantar fasciitis of the medial band of the plantar fascia adjacent to the calcaneal insertion with a high-grade partial-thickness tear. 2. Moderate tenosynovitis of the extensor digitorum longus tendon. Electronically Signed   By: Kathreen Devoid   On: 08/20/2018 08:25    PMFS History: Patient Active Problem List   Diagnosis Date Noted  . HYPERTENSION 11/10/2009  . COLONIC POLYPS, HYPERPLASTIC 11/09/2009  . HYPOTHYROIDISM 11/09/2009  . HYPERLIPIDEMIA 11/09/2009  . GERD 11/09/2009  . HIATAL HERNIA 11/09/2009  . IBS 11/09/2009   Past Medical History:  Diagnosis Date  . Colon polyps   . GERD  (gastroesophageal reflux disease)   . Hiatal hernia   . Hyperlipidemia   . Hypertension   . Hypothyroidism   . IBS (irritable bowel syndrome)     Family History  Problem Relation Age of Onset  . Colon cancer Mother   . Liver cancer Mother   . Breast cancer Sister     Past Surgical History:  Procedure Laterality Date  . ABDOMINAL HYSTERECTOMY    . APPENDECTOMY    . BACK SURGERY    . BACK SURGERY     L5-S1  . CATARACT EXTRACTION    . CERVICAL FUSION    . CHOLECYSTECTOMY    . WRIST FRACTURE SURGERY     Social History   Occupational History  . Occupation: nurse  Tobacco Use  . Smoking status: Never Smoker  . Smokeless tobacco: Never Used  Substance and Sexual Activity  . Alcohol use: No    Alcohol/week: 0.0 standard drinks  . Drug use: No  . Sexual activity: Not on file

## 2018-09-04 ENCOUNTER — Ambulatory Visit (INDEPENDENT_AMBULATORY_CARE_PROVIDER_SITE_OTHER): Payer: Medicare Other | Admitting: Physical Medicine and Rehabilitation

## 2018-09-04 ENCOUNTER — Encounter (INDEPENDENT_AMBULATORY_CARE_PROVIDER_SITE_OTHER): Payer: Self-pay | Admitting: Physical Medicine and Rehabilitation

## 2018-09-04 ENCOUNTER — Ambulatory Visit (INDEPENDENT_AMBULATORY_CARE_PROVIDER_SITE_OTHER): Payer: Self-pay

## 2018-09-04 VITALS — BP 147/67 | HR 67 | Ht 66.0 in | Wt 165.0 lb

## 2018-09-04 DIAGNOSIS — G8929 Other chronic pain: Secondary | ICD-10-CM | POA: Diagnosis not present

## 2018-09-04 DIAGNOSIS — M545 Low back pain: Secondary | ICD-10-CM | POA: Diagnosis not present

## 2018-09-04 DIAGNOSIS — M25551 Pain in right hip: Secondary | ICD-10-CM

## 2018-09-04 DIAGNOSIS — M7061 Trochanteric bursitis, right hip: Secondary | ICD-10-CM

## 2018-09-04 NOTE — Progress Notes (Signed)
 .  Numeric Pain Rating Scale and Functional Assessment Average Pain 4 Pain Right Now 3 My pain is intermittent, sharp and aching Pain is worse with: sitting, standing and some activites Pain improves with: medication   In the last MONTH (on 0-10 scale) has pain interfered with the following?  1. General activity like being  able to carry out your everyday physical activities such as walking, climbing stairs, carrying groceries, or moving a chair?  Rating(4)  2. Relation with others like being able to carry out your usual social activities and roles such as  activities at home, at work and in your community. Rating(1)  3. Enjoyment of life such that you have  been bothered by emotional problems such as feeling anxious, depressed or irritable?  Rating(0)

## 2018-09-12 DIAGNOSIS — M722 Plantar fascial fibromatosis: Secondary | ICD-10-CM | POA: Diagnosis not present

## 2018-10-11 DIAGNOSIS — M19071 Primary osteoarthritis, right ankle and foot: Secondary | ICD-10-CM | POA: Diagnosis not present

## 2018-12-03 DIAGNOSIS — L718 Other rosacea: Secondary | ICD-10-CM | POA: Diagnosis not present

## 2018-12-03 DIAGNOSIS — L820 Inflamed seborrheic keratosis: Secondary | ICD-10-CM | POA: Diagnosis not present

## 2018-12-03 DIAGNOSIS — I788 Other diseases of capillaries: Secondary | ICD-10-CM | POA: Diagnosis not present

## 2018-12-03 DIAGNOSIS — L821 Other seborrheic keratosis: Secondary | ICD-10-CM | POA: Diagnosis not present

## 2019-03-27 DIAGNOSIS — G8929 Other chronic pain: Secondary | ICD-10-CM | POA: Diagnosis not present

## 2019-03-27 DIAGNOSIS — M25551 Pain in right hip: Secondary | ICD-10-CM

## 2019-03-27 DIAGNOSIS — M7061 Trochanteric bursitis, right hip: Secondary | ICD-10-CM | POA: Diagnosis not present

## 2019-03-27 DIAGNOSIS — M545 Low back pain: Secondary | ICD-10-CM | POA: Diagnosis not present

## 2019-03-27 MED ORDER — BUPIVACAINE HCL 0.25 % IJ SOLN
4.0000 mL | INTRAMUSCULAR | Status: AC | PRN
  Administered 2019-03-27: 09:00:00 4 mL via INTRA_ARTICULAR

## 2019-03-27 MED ORDER — LIDOCAINE HCL 2 % IJ SOLN
4.0000 mL | INTRAMUSCULAR | Status: AC | PRN
  Administered 2019-03-27: 4 mL

## 2019-03-27 MED ORDER — TRIAMCINOLONE ACETONIDE 40 MG/ML IJ SUSP
80.0000 mg | INTRAMUSCULAR | Status: AC | PRN
  Administered 2019-03-27: 80 mg via INTRA_ARTICULAR

## 2019-03-27 NOTE — Progress Notes (Signed)
Laura Riddle - 84 y.o. female MRN [id number]  Date of birth: [birth year]  Office Visit Note: Visit Date: 09/04/2018 PCP: Shon Baton, MD Referred by: Shon Baton, MD  Subjective: Chief Complaint  Patient presents with   Right Hip - Pain   HPI: Laura Riddle is a 84 y.o. female who comes in today With return of right lateral hip pain.  I have not seen the patient since 2013.  At that time we did complete greater trochanteric injection on a few occasions using fluoroscopic guidance which seemed to be very beneficial.  She had prior greater trochanteric injections by [facility] Dr. Durward Fortes without as much success.  She continues to see Dr. Durward Fortes and [facility] for plantar fasciitis and other orthopedic complaints but has not seen them recently for her hip.  She denies any right groin pain or left groin pain.  No pain over the left lateral hip.  She does endorse some back pain in general but does not relate the 2.  She is had no paresthesias or numbness or tingling.  She has had no fevers chills or night sweats or focal trauma.  She is not endorse any weakness.  She reports ongoing pain over the greater trochanteric area now for quite some time.  She gets some pain with laying on that side also some pain with walking.  She reports essentially 3 months of continued worsening pain.  Advil helps a little bit.  She reports worse with sitting and standing and the pain can be intermittent sharp and aching.  She has not had any prior spine interventions.  She really has not had any work-up of her spine at all.  She really does not endorse much in the way of back pain.  She is very active at 89.  Review of Systems  Constitutional: Negative for chills, fever, malaise/fatigue and weight loss.  HENT: Negative for hearing loss and sinus pain.   Eyes: Negative for blurred vision, double vision and photophobia.  Respiratory: Negative for cough and shortness of breath.   Cardiovascular:  Negative for chest pain, palpitations and leg swelling.  Gastrointestinal: Negative for abdominal pain, nausea and vomiting.  Genitourinary: Negative for flank pain.  Musculoskeletal: Positive for back pain and joint pain. Negative for myalgias.  Skin: Negative for itching and rash.  Neurological: Negative for tremors, focal weakness and weakness.  Endo/Heme/Allergies: Negative.   Psychiatric/Behavioral: Negative for depression.  All other systems reviewed and are negative.  Otherwise per HPI.  Assessment & Plan: Visit Diagnoses:  1. Pain in right hip   2. Chronic right-sided low back pain without sciatica   3. Greater trochanteric bursitis, right     Plan: Findings:  Chronic worsening right lateral hip pain consistent with greater trochanteric bursitis.  Patient has a long history through Dr. Durward Fortes of multiple areas of tendinitis and bursitis.  She has no history of fibromyalgia.  She is really had no work-up of her lumbar spine but I do feel like this is more greater trochanteric bursitis.  We are going to complete diagnostic Novi therapeutic greater trochanteric injection with fluoroscopic guidance.  In the past greater trochanteric injections were performed by Dr. Durward Fortes without as much success as we had with prior fluoroscopically guided injection.  Depending on relief would look at work-up of her hip or her low back.  She will continue to see Dr. Durward Fortes for orthopedic care.  We did go over stretches for the piriformis and greater trochanteric area and  gluteus medius.    Meds & Orders: No orders of the defined types were placed in this encounter.   Orders Placed This Encounter  Procedures   Large Joint Inj: R greater trochanter   XR C-ARM NO REPORT    Follow-up: Return if symptoms worsen or fail to improve.   Procedures: Large Joint Inj: R greater trochanter on 03/27/2019 9:04 AM Indications: pain and diagnostic evaluation Details: 22 G 3.5 in needle,  fluoroscopy-guided lateral approach  Arthrogram: No  Medications: 4 mL lidocaine 2 %; 80 mg triamcinolone acetonide 40 MG/ML; 4 mL bupivacaine 0.25 % Outcome: tolerated well, no immediate complications  There was excellent flow of contrast outlined the greater trochanteric bursa without vascular uptake. Procedure, treatment alternatives, risks and benefits explained, specific risks discussed. Consent was given by the patient. Immediately prior to procedure a time out was called to verify the correct patient, procedure, equipment, support staff and site/side marked as required. Patient was prepped and draped in the usual sterile fashion.      No notes on file   Clinical History: No specialty comments available.   She reports that she has never smoked. She has never used smokeless tobacco. No results for input(s): HGBA1C, LABURIC in the last 8760 hours.  Objective:  VS:  HT:5\' 6"  (167.6 cm)    WT:165 lb (74.8 kg)   BMI:26.64     BP:(!) 147/67   HR:67bpm   TEMP: ( )   RESP:99 % Physical Exam Vitals signs and nursing note reviewed.  Constitutional:      General: She is not in acute distress.    Appearance: Normal appearance. She is well-developed. She is not ill-appearing.  HENT:     Head: Normocephalic and atraumatic.  Eyes:     Conjunctiva/sclera: Conjunctivae normal.     Pupils: Pupils are equal, round, and reactive to light.  Cardiovascular:     Rate and Rhythm: Normal rate.     Pulses: Normal pulses.  Pulmonary:     Effort: Pulmonary effort is normal.  Musculoskeletal:     Right lower leg: No edema.     Left lower leg: No edema.     Comments: Patient ambulates without aid she is somewhat slow to rise to a full extended position from sitting.  She does have pain over the right greater trochanter that does reproduce her pain.  Mild tenderness over the left.  No pain with hip rotation.  Good distal strength without clonus.  Some pain with facet loading of the lumbar spine.   Skin:    General: Skin is warm and dry.     Findings: No erythema or rash.  Neurological:     General: No focal deficit present.     Mental Status: She is alert and oriented to person, place, and time.     Sensory: No sensory deficit.     Motor: No abnormal muscle tone.     Coordination: Coordination normal.     Gait: Gait normal.  Psychiatric:        Mood and Affect: Mood normal.        Behavior: Behavior normal.        Thought Content: Thought content normal.     Ortho Exam Imaging: No results found.  Past Medical/Family/Surgical/Social History: Medications & Allergies reviewed per EMR, new medications updated. Patient Active Problem List   Diagnosis Date Noted   HYPERTENSION 11/10/2009   COLONIC POLYPS, HYPERPLASTIC 11/09/2009   HYPOTHYROIDISM 11/09/2009   HYPERLIPIDEMIA 11/09/2009  GERD 11/09/2009   HIATAL HERNIA 11/09/2009   IBS 11/09/2009   Past Medical History:  Diagnosis Date   Colon polyps    GERD (gastroesophageal reflux disease)    Hiatal hernia    Hyperlipidemia    Hypertension    Hypothyroidism    IBS (irritable bowel syndrome)    Family History  Problem Relation Age of Onset   Colon cancer Mother    Liver cancer Mother    Breast cancer Sister    Past Surgical History:  Procedure Laterality Date   ABDOMINAL HYSTERECTOMY     APPENDECTOMY     BACK SURGERY     BACK SURGERY     L5-S1   CATARACT EXTRACTION     CERVICAL FUSION     CHOLECYSTECTOMY     WRIST FRACTURE SURGERY     Social History   Occupational History   Occupation: nurse  Tobacco Use   Smoking status: Never Smoker   Smokeless tobacco: Never Used  Substance and Sexual Activity   Alcohol use: No    Alcohol/week: 0.0 standard drinks   Drug use: No   Sexual activity: Not on file

## 2019-05-06 DIAGNOSIS — M79671 Pain in right foot: Secondary | ICD-10-CM | POA: Diagnosis not present

## 2019-06-03 DIAGNOSIS — L309 Dermatitis, unspecified: Secondary | ICD-10-CM | POA: Diagnosis not present

## 2019-06-14 DIAGNOSIS — R739 Hyperglycemia, unspecified: Secondary | ICD-10-CM | POA: Diagnosis not present

## 2019-06-14 DIAGNOSIS — I10 Essential (primary) hypertension: Secondary | ICD-10-CM | POA: Diagnosis not present

## 2019-06-14 DIAGNOSIS — E038 Other specified hypothyroidism: Secondary | ICD-10-CM | POA: Diagnosis not present

## 2019-06-14 DIAGNOSIS — M859 Disorder of bone density and structure, unspecified: Secondary | ICD-10-CM | POA: Diagnosis not present

## 2019-06-14 DIAGNOSIS — E7849 Other hyperlipidemia: Secondary | ICD-10-CM | POA: Diagnosis not present

## 2019-06-17 ENCOUNTER — Other Ambulatory Visit: Payer: Self-pay

## 2019-06-17 DIAGNOSIS — R6889 Other general symptoms and signs: Secondary | ICD-10-CM | POA: Diagnosis not present

## 2019-06-17 DIAGNOSIS — Z20822 Contact with and (suspected) exposure to covid-19: Secondary | ICD-10-CM

## 2019-06-20 LAB — NOVEL CORONAVIRUS, NAA: SARS-CoV-2, NAA: NOT DETECTED

## 2019-07-24 DIAGNOSIS — L821 Other seborrheic keratosis: Secondary | ICD-10-CM | POA: Diagnosis not present

## 2019-08-04 IMAGING — MR MR FOOT*R* W/O CM
4 of 5 series · 17 of 40 positions shown · non-contrast
Comparison: None.

CLINICAL DATA: Right hindfoot pain for 3 months.  Plantar pain.

EXAM:
MRI OF THE RIGHT FOREFOOT WITHOUT CONTRAST
TECHNIQUE: Multiplanar, multisequence MR imaging of the right foot was
performed. No intravenous contrast was administered.

[Series 5: T2 fat-sat · axial · 3.0mm · 0.50mm/px · z∈[-71,+29]mm · 3 of 33 slices shown (1 of 2)]
[im 4/33]
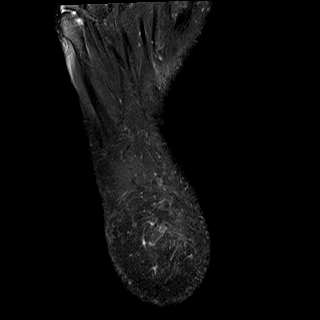
[im 18/33]
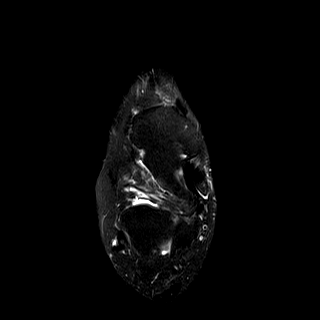
[im 29/33]
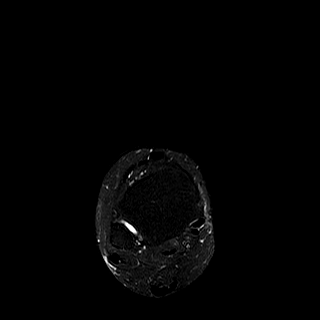

[Series 6: PD fat-sat · axial · 3.0mm · 0.31mm/px · z∈[-83,+29]mm · 8 of 33 slices shown]
[im 1/33]
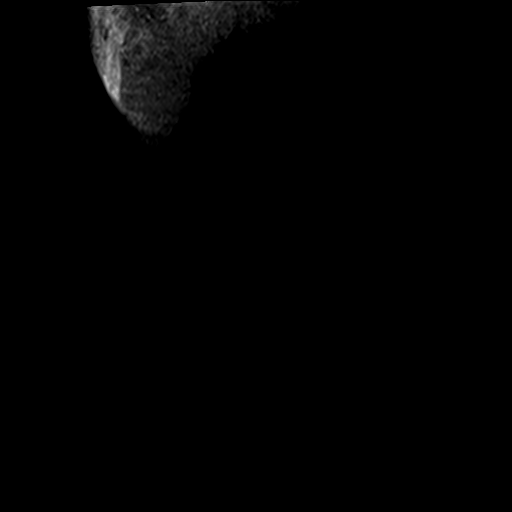
[im 5/33]
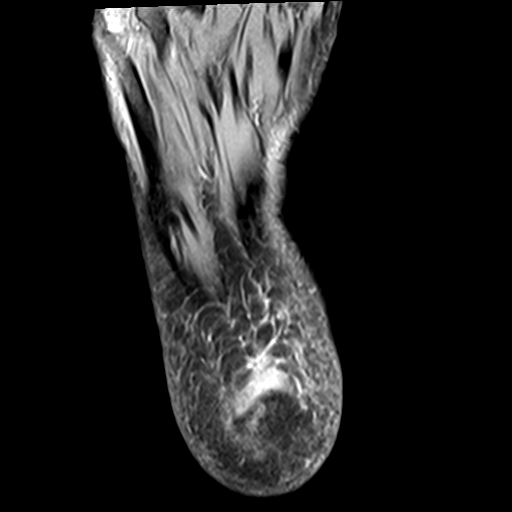
[im 9/33]
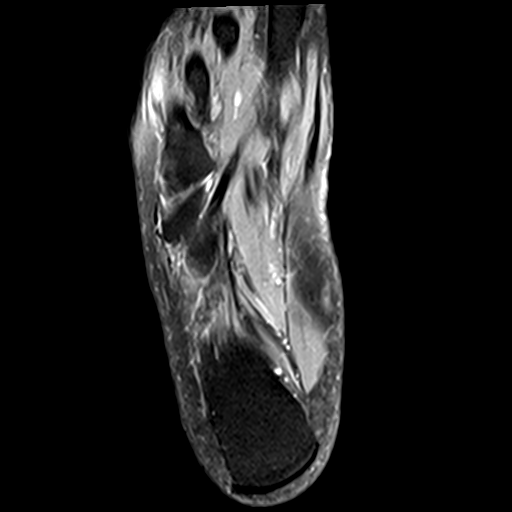
[im 13/33]
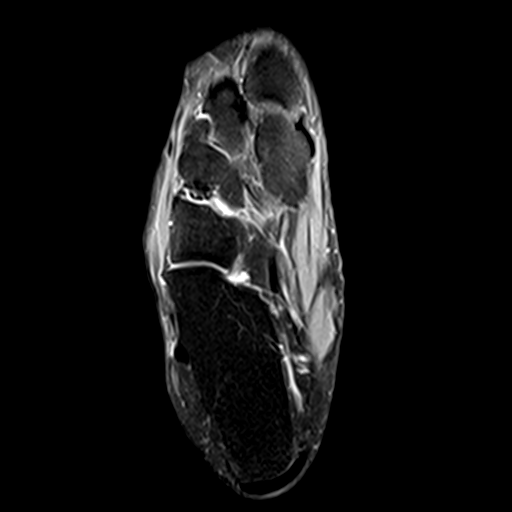
[im 17/33]
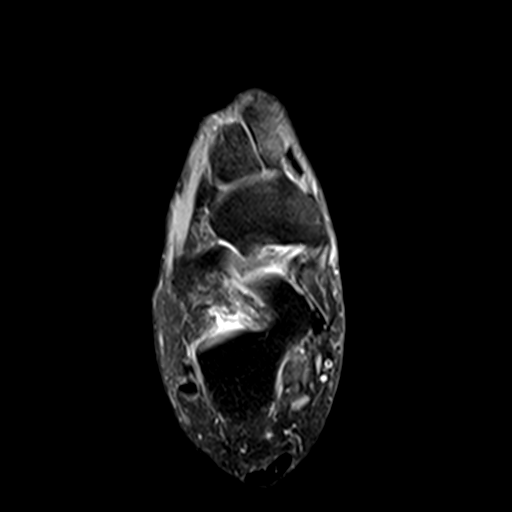
[im 21/33]
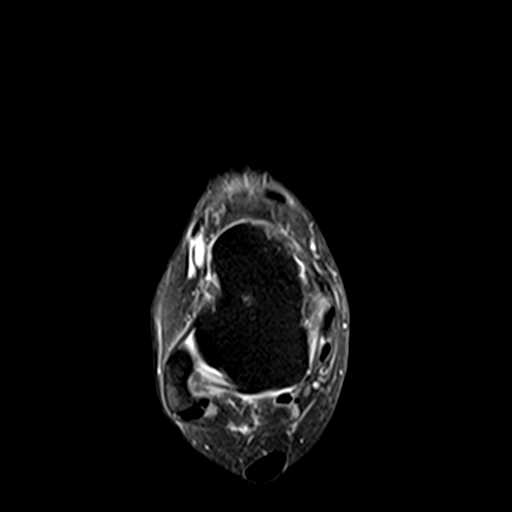
[im 25/33]
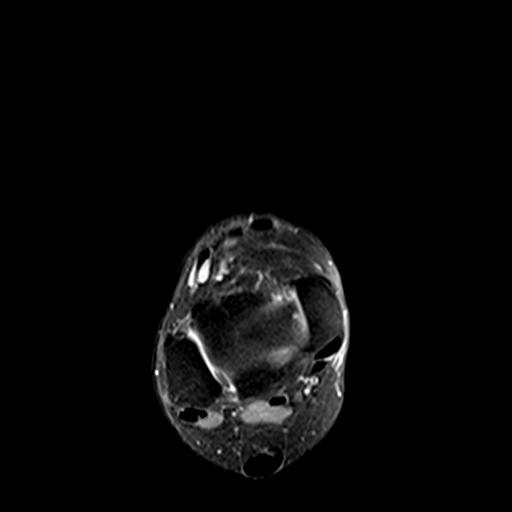
[im 29/33]
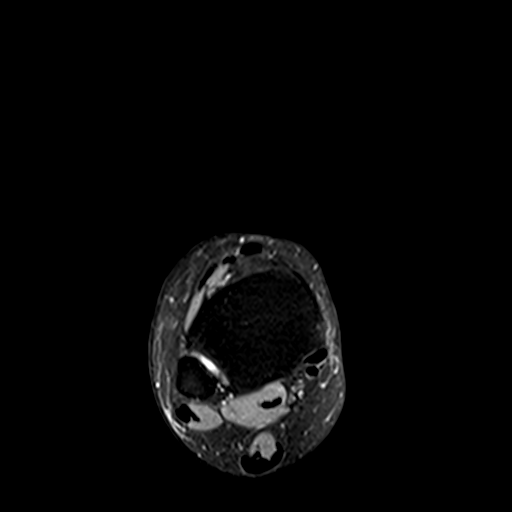

[Series 7: T1 · sagittal · 4.0mm · 0.21mm/px · 3 of 19 slices shown]
[im 1/19]
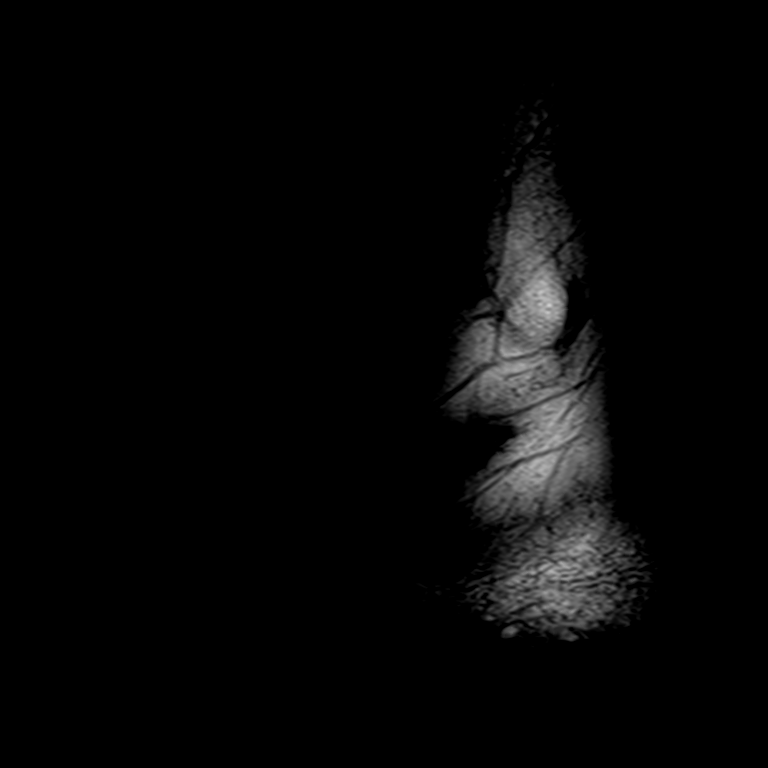
[im 10/19]
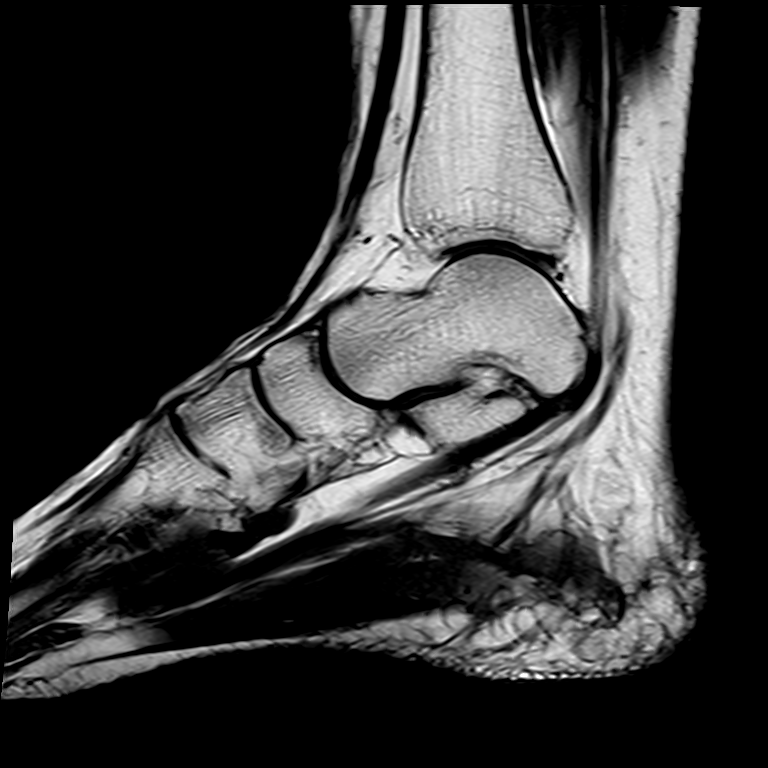
[im 19/19]
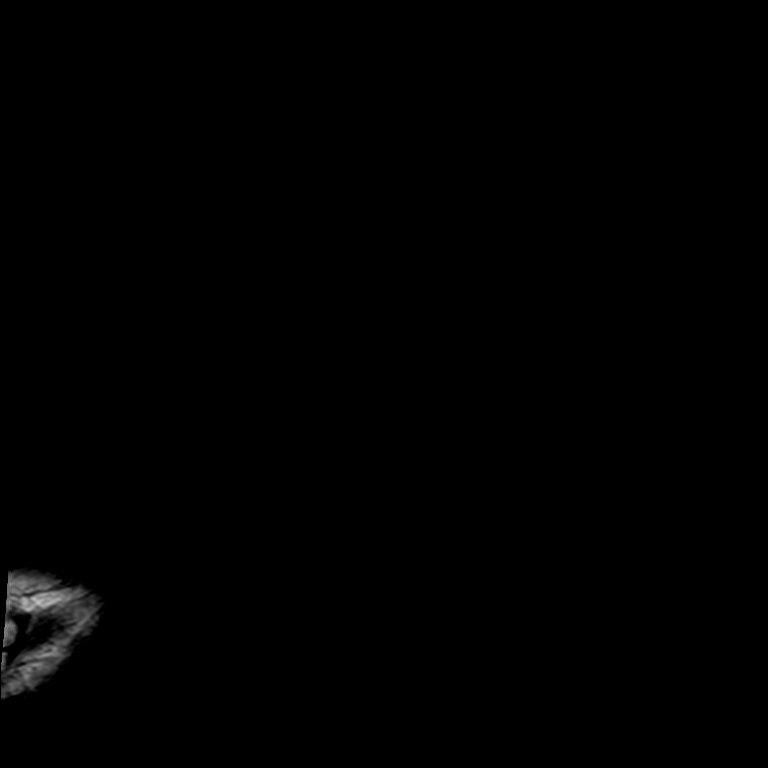

[Series 8: T2 fat-sat · coronal · 3.0mm · 0.29mm/px · 3 of 40 slices shown (2 of 2)]
[im 4/40]
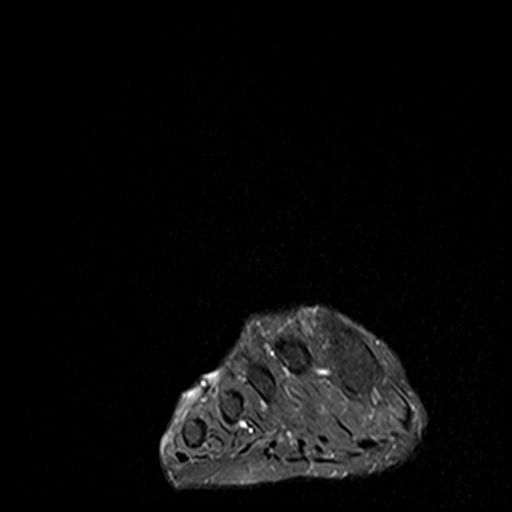
[im 20/40]
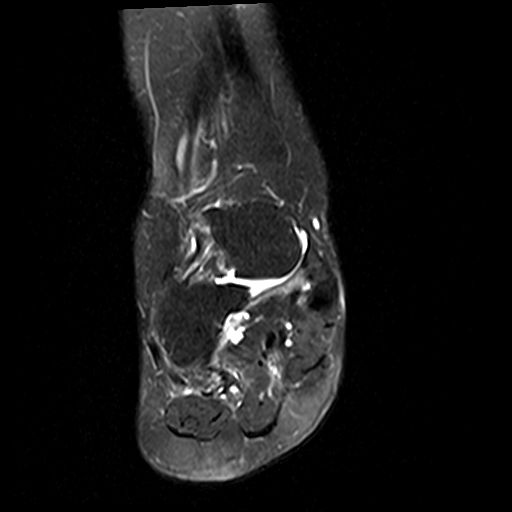
[im 36/40]
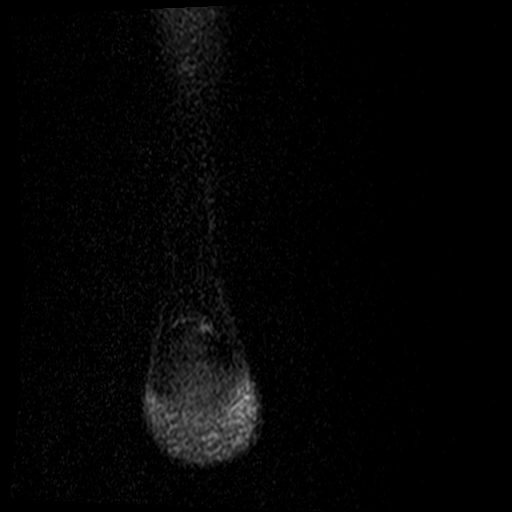

[17 of 40 positions shown; findings below may reference images not displayed]

FINDINGS: TENDONS

Peroneal: Peroneal longus tendon intact. Mild tendinosis of the
peroneus brevis with a short-segment longitudinal split tear.

Posteromedial: Posterior tibial tendon intact. Flexor hallucis
longus tendon intact. Flexor digitorum longus tendon intact.

Anterior: Tibialis anterior tendon intact. Extensor hallucis longus
tendon intact Extensor digitorum longus tendon intact. Moderate
amount of fluid in the extensor digitorum longus tendon sheath as
can be seen with tenosynovitis.

Achilles:  Intact.

Plantar Fascia: Severe thickening and increased signal in the medial
band of the plantar fascia adjacent to the calcaneal insertion with
a high-grade partial-thickness tear.

LIGAMENTS

Lateral: Anterior talofibular ligament intact. Calcaneofibular
ligament intact. Posterior talofibular ligament intact. Anterior and
posterior tibiofibular ligaments intact.

Medial: Deltoid ligament intact. Spring ligament intact.

CARTILAGE

Ankle Joint: No joint effusion. Normal ankle mortise. No chondral
defect.

Subtalar Joints/Sinus Tarsi: Normal subtalar joints. No subtalar
joint effusion. Normal sinus tarsi.

Bones: No marrow signal abnormality. No fracture or dislocation.
Relative Paulus N Ceejay.

Soft Tissue: No fluid collection or hematoma.  Muscles are normal.
IMPRESSION: 1. Severe plantar fasciitis of the medial band of the plantar fascia
adjacent to the calcaneal insertion with a high-grade
partial-thickness tear.
2. Moderate tenosynovitis of the extensor digitorum longus tendon.

## 2019-08-13 DIAGNOSIS — Z961 Presence of intraocular lens: Secondary | ICD-10-CM | POA: Diagnosis not present

## 2019-08-13 DIAGNOSIS — H52203 Unspecified astigmatism, bilateral: Secondary | ICD-10-CM | POA: Diagnosis not present

## 2019-08-19 DIAGNOSIS — M65331 Trigger finger, right middle finger: Secondary | ICD-10-CM | POA: Diagnosis not present

## 2019-08-19 DIAGNOSIS — M79641 Pain in right hand: Secondary | ICD-10-CM | POA: Diagnosis not present

## 2019-09-16 DIAGNOSIS — M65331 Trigger finger, right middle finger: Secondary | ICD-10-CM | POA: Diagnosis not present

## 2019-09-16 DIAGNOSIS — M79641 Pain in right hand: Secondary | ICD-10-CM | POA: Diagnosis not present

## 2019-10-09 DIAGNOSIS — F430 Acute stress reaction: Secondary | ICD-10-CM | POA: Diagnosis not present

## 2019-12-25 DIAGNOSIS — E039 Hypothyroidism, unspecified: Secondary | ICD-10-CM | POA: Diagnosis not present

## 2020-01-23 DIAGNOSIS — Z0000 Encounter for general adult medical examination without abnormal findings: Secondary | ICD-10-CM | POA: Diagnosis not present

## 2020-03-05 DIAGNOSIS — F430 Acute stress reaction: Secondary | ICD-10-CM | POA: Diagnosis not present

## 2020-04-15 DIAGNOSIS — L820 Inflamed seborrheic keratosis: Secondary | ICD-10-CM | POA: Diagnosis not present

## 2020-04-15 DIAGNOSIS — L821 Other seborrheic keratosis: Secondary | ICD-10-CM | POA: Diagnosis not present

## 2020-04-15 DIAGNOSIS — L718 Other rosacea: Secondary | ICD-10-CM | POA: Diagnosis not present

## 2020-06-15 DIAGNOSIS — E038 Other specified hypothyroidism: Secondary | ICD-10-CM | POA: Diagnosis not present

## 2020-06-15 DIAGNOSIS — R739 Hyperglycemia, unspecified: Secondary | ICD-10-CM | POA: Diagnosis not present

## 2020-06-15 DIAGNOSIS — M859 Disorder of bone density and structure, unspecified: Secondary | ICD-10-CM | POA: Diagnosis not present

## 2020-06-15 DIAGNOSIS — E7849 Other hyperlipidemia: Secondary | ICD-10-CM | POA: Diagnosis not present

## 2020-06-22 DIAGNOSIS — Z1331 Encounter for screening for depression: Secondary | ICD-10-CM | POA: Diagnosis not present

## 2020-06-22 DIAGNOSIS — R82998 Other abnormal findings in urine: Secondary | ICD-10-CM | POA: Diagnosis not present

## 2020-08-13 DIAGNOSIS — Z4789 Encounter for other orthopedic aftercare: Secondary | ICD-10-CM | POA: Diagnosis not present

## 2020-08-13 DIAGNOSIS — R52 Pain, unspecified: Secondary | ICD-10-CM | POA: Diagnosis not present

## 2020-08-13 DIAGNOSIS — M65321 Trigger finger, right index finger: Secondary | ICD-10-CM | POA: Diagnosis not present

## 2020-08-27 DIAGNOSIS — M79641 Pain in right hand: Secondary | ICD-10-CM | POA: Diagnosis not present

## 2020-08-29 DIAGNOSIS — Z23 Encounter for immunization: Secondary | ICD-10-CM | POA: Diagnosis not present

## 2020-09-01 DIAGNOSIS — M79641 Pain in right hand: Secondary | ICD-10-CM | POA: Diagnosis not present

## 2020-09-08 DIAGNOSIS — M79641 Pain in right hand: Secondary | ICD-10-CM | POA: Diagnosis not present

## 2021-01-01 DIAGNOSIS — H52203 Unspecified astigmatism, bilateral: Secondary | ICD-10-CM | POA: Diagnosis not present

## 2021-01-01 DIAGNOSIS — Z961 Presence of intraocular lens: Secondary | ICD-10-CM | POA: Diagnosis not present

## 2021-01-01 DIAGNOSIS — E119 Type 2 diabetes mellitus without complications: Secondary | ICD-10-CM | POA: Diagnosis not present

## 2021-02-24 ENCOUNTER — Ambulatory Visit (INDEPENDENT_AMBULATORY_CARE_PROVIDER_SITE_OTHER): Payer: Medicare Other

## 2021-02-24 ENCOUNTER — Ambulatory Visit (INDEPENDENT_AMBULATORY_CARE_PROVIDER_SITE_OTHER): Payer: Medicare Other | Admitting: Orthopaedic Surgery

## 2021-02-24 ENCOUNTER — Encounter: Payer: Self-pay | Admitting: Orthopaedic Surgery

## 2021-02-24 ENCOUNTER — Other Ambulatory Visit: Payer: Self-pay

## 2021-02-24 VITALS — Ht 66.0 in | Wt 165.0 lb

## 2021-02-24 DIAGNOSIS — M25511 Pain in right shoulder: Secondary | ICD-10-CM | POA: Insufficient documentation

## 2021-02-24 DIAGNOSIS — G8929 Other chronic pain: Secondary | ICD-10-CM

## 2021-02-24 MED ORDER — LIDOCAINE HCL 2 % IJ SOLN
2.0000 mL | INTRAMUSCULAR | Status: AC | PRN
  Administered 2021-02-24: 2 mL

## 2021-02-24 MED ORDER — BUPIVACAINE HCL 0.25 % IJ SOLN
2.0000 mL | INTRAMUSCULAR | Status: AC | PRN
  Administered 2021-02-24: 2 mL via INTRA_ARTICULAR

## 2021-02-24 NOTE — Progress Notes (Signed)
Office Visit Note   Patient: Laura Riddle           Date of Birth: Aug 19, 1935           MRN: [id number] Visit Date: 02/24/2021              Requested by: Shon Baton, [address] [place],  [place] [id number] PCP: Shon Baton, MD   Assessment & Plan: Visit Diagnoses:  1. Chronic right shoulder pain   Abbigael fell 3 weeks ago and hit the anterior aspect of her right shoulder.  She notes that she is not sure why she fell and she may be even a passed out.  She is not having any improvement in her shoulder pain and difficulty sleeping and raising her arm over her head.  Prior to the fall she was doing well.  She has not had any numbness or tingling.  She has tried ibuprofen and ice.  No prior problems.  X-rays reveal some degenerative change at the [facility] joint but otherwise were fine.  This certainly is a chance she could have a rotator cuff tear.  Before obtaining further diagnostic studies I would like to try a subacromial cortisone injection and monitor response.  Follow-Up Instructions: Return if symptoms worsen or fail to improve.   Orders:  Orders Placed This Encounter  Procedures  . Large Joint Inj: R subacromial bursa  . XR Shoulder Right   No orders of the defined types were placed in this encounter.     Procedures: Large Joint Inj: R subacromial bursa on 02/24/2021 4:39 PM Indications: pain and diagnostic evaluation Details: 25 G 1.5 in needle, anterolateral approach  Arthrogram: No  Medications: 2 mL lidocaine 2 %; 2 mL bupivacaine 0.25 %  12 mg betamethasone injected the subacromial space right shoulder with Marcaine and Xylocaine Consent was given by the patient. Immediately prior to procedure a time out was called to verify the correct patient, procedure, equipment, support staff and site/side marked as required. Patient was prepped and draped in the usual sterile fashion.       Clinical Data: No additional findings.   Subjective: Chief Complaint   Patient presents with  . Right Shoulder - Pain  Patient presents today for right shoulder pain. She fell in her kitchen on 01/31/2021 when she bent over and lost her balance. She hit her face, shoulder, and knees. Her pain is located at the anterior and posterior aspect of her arm. She has pain superiorly when she lifts her arm. She has decreased range of motion. No numbness or tingling. She is right hand dominant. Icing helps. She has been taking Ibuprofen for pain relief. No previous evaluation since the fall.   HPI  Review of Systems   Objective: Vital Signs: Ht 5\' 6"  (1.676 m)   Wt 165 lb (74.8 kg)   BMI 26.63 kg/m   Physical Exam Constitutional:      Appearance: She is well-developed.  Eyes:     Pupils: Pupils are equal, round, and reactive to light.  Pulmonary:     Effort: Pulmonary effort is normal.  Skin:    General: Skin is warm and dry.  Neurological:     Mental Status: She is alert and oriented to person, place, and time.  Psychiatric:        Behavior: Behavior normal.     Ortho Exam right shoulder with full overhead motion but with somewhat of a circuitous arc of motion.  Good strength with internal  rotation but pain with external rotation with her arm at his side.  Biceps intact.  Some anterior pain in the anterior subacromial region.  No pain at the [facility] joint.  Minimally positive Speed sign Specialty Comments:  No specialty comments available.  Imaging: XR Shoulder Right  Result Date: 02/24/2021 Films of the right shoulder obtained in several projections.  There are some bulky degenerative changes at the [facility] joint particularly with osteophytes directed superiorly.  Mild narrowing of the AC joint.  Normal space between the humeral head and the acromion.  Humeral head is centered at the glenoid.  No ectopic calcification or acute changes.    PMFS History: Patient Active Problem List   Diagnosis Date Noted  . Pain in right shoulder 02/24/2021  . HYPERTENSION  11/10/2009  . COLONIC POLYPS, HYPERPLASTIC 11/09/2009  . HYPOTHYROIDISM 11/09/2009  . HYPERLIPIDEMIA 11/09/2009  . GERD 11/09/2009  . HIATAL HERNIA 11/09/2009  . IBS 11/09/2009   Past Medical History:  Diagnosis Date  . Colon polyps   . GERD (gastroesophageal reflux disease)   . Hiatal hernia   . Hyperlipidemia   . Hypertension   . Hypothyroidism   . IBS (irritable bowel syndrome)     Family History  Problem Relation Age of Onset  . Colon cancer Mother   . Liver cancer Mother   . Breast cancer Sister     Past Surgical History:  Procedure Laterality Date  . ABDOMINAL HYSTERECTOMY    . APPENDECTOMY    . BACK SURGERY    . BACK SURGERY     L5-S1  . CATARACT EXTRACTION    . CERVICAL FUSION    . CHOLECYSTECTOMY    . WRIST FRACTURE SURGERY     Social History   Occupational History  . Occupation: nurse  Tobacco Use  . Smoking status: Never Smoker  . Smokeless tobacco: Never Used  Vaping Use  . Vaping Use: Never used  Substance and Sexual Activity  . Alcohol use: No    Alcohol/week: 0.0 standard drinks  . Drug use: No  . Sexual activity: Not on file

## 2021-03-06 DIAGNOSIS — Z23 Encounter for immunization: Secondary | ICD-10-CM | POA: Diagnosis not present

## 2021-03-17 DIAGNOSIS — Z803 Family history of malignant neoplasm of breast: Secondary | ICD-10-CM | POA: Diagnosis not present

## 2021-03-17 DIAGNOSIS — Z1231 Encounter for screening mammogram for malignant neoplasm of breast: Secondary | ICD-10-CM | POA: Diagnosis not present

## 2021-04-06 ENCOUNTER — Other Ambulatory Visit: Payer: Self-pay

## 2021-04-06 DIAGNOSIS — M25551 Pain in right hip: Secondary | ICD-10-CM

## 2021-04-20 ENCOUNTER — Encounter: Payer: Self-pay | Admitting: Physical Medicine and Rehabilitation

## 2021-04-20 ENCOUNTER — Other Ambulatory Visit: Payer: Self-pay

## 2021-04-20 ENCOUNTER — Ambulatory Visit (INDEPENDENT_AMBULATORY_CARE_PROVIDER_SITE_OTHER): Payer: Medicare Other | Admitting: Physical Medicine and Rehabilitation

## 2021-04-20 ENCOUNTER — Ambulatory Visit: Payer: Self-pay

## 2021-04-20 DIAGNOSIS — M7061 Trochanteric bursitis, right hip: Secondary | ICD-10-CM

## 2021-04-20 MED ORDER — TRIAMCINOLONE ACETONIDE 40 MG/ML IJ SUSP
60.0000 mg | INTRAMUSCULAR | Status: AC | PRN
  Administered 2021-04-20: 60 mg via INTRA_ARTICULAR

## 2021-04-20 MED ORDER — LIDOCAINE HCL 2 % IJ SOLN
4.0000 mL | INTRAMUSCULAR | Status: AC | PRN
  Administered 2021-04-20: 4 mL

## 2021-04-20 MED ORDER — BUPIVACAINE HCL 0.25 % IJ SOLN
4.0000 mL | INTRAMUSCULAR | Status: AC | PRN
  Administered 2021-04-20: 4 mL via INTRA_ARTICULAR

## 2021-04-20 NOTE — Progress Notes (Signed)
Pt state right hip pain. Pt state laying down makes the pain worse. Pt state when she first wake up. Pt state she uses heating, ice and over the counter pain meds to help ease her pain.  Numeric Pain Rating Scale and Functional Assessment Average Pain 4   In the last MONTH (on 0-10 scale) has pain interfered with the following?  1. General activity like being  able to carry out your everyday physical activities such as walking, climbing stairs, carrying groceries, or moving a chair?  Rating(10)   -BT, -Dye Allergies.

## 2021-04-20 NOTE — Progress Notes (Signed)
   Laura Riddle - 86 y.o. female MRN [id number]  Date of birth: Feb 07, 1935  Office Visit Note: Visit Date: 04/20/2021 PCP: Shon Baton, MD Referred by: Shon Baton, MD  Subjective: Chief Complaint  Patient presents with  . Right Hip - Pain   HPI:  Laura Riddle is a 86 y.o. female who comes in today at the request of Dr. Joni Fears for planned Right greater trochanteric bursa injection with fluoroscopic guidance.  The patient has failed conservative care including home exercise, medications, time and activity modification.  This injection will be diagnostic and hopefully therapeutic.  Please see requesting physician notes for further details and justification.   ROS Otherwise per HPI.  Assessment & Plan: Visit Diagnoses:    ICD-10-CM   1. Greater trochanteric bursitis, right  M70.61 XR C-ARM NO REPORT    Plan: No additional findings.   Meds & Orders: No orders of the defined types were placed in this encounter.   Orders Placed This Encounter  Procedures  . Large Joint Inj  . XR C-ARM NO REPORT    Follow-up: Return if symptoms worsen or fail to improve.   Procedures: Large Joint Inj: R greater trochanter on 04/20/2021 1:36 PM Indications: pain and diagnostic evaluation Details: 22 G 3.5 in needle, fluoroscopy-guided lateral approach  Arthrogram: No  Medications: 4 mL lidocaine 2 %; 4 mL bupivacaine 0.25 %; 60 mg triamcinolone acetonide 40 MG/ML Outcome: tolerated well, no immediate complications  There was excellent flow of contrast outlined the greater trochanteric bursa without vascular uptake. Procedure, treatment alternatives, risks and benefits explained, specific risks discussed. Consent was given by the patient. Immediately prior to procedure a time out was called to verify the correct patient, procedure, equipment, support staff and site/side marked as required. Patient was prepped and draped in the usual sterile fashion.          Clinical  History: No specialty comments available.     Objective:  VS:  HT:    WT:   BMI:     BP:   HR: bpm  TEMP: ( )  RESP:  Physical Exam Musculoskeletal:     Comments: Concordant pain over the right bursa.      Imaging: No results found.

## 2021-05-13 ENCOUNTER — Encounter: Payer: Self-pay | Admitting: Orthopaedic Surgery

## 2021-05-13 ENCOUNTER — Ambulatory Visit (INDEPENDENT_AMBULATORY_CARE_PROVIDER_SITE_OTHER): Payer: Medicare Other | Admitting: Orthopaedic Surgery

## 2021-05-13 ENCOUNTER — Other Ambulatory Visit: Payer: Self-pay

## 2021-05-13 ENCOUNTER — Ambulatory Visit: Payer: Self-pay

## 2021-05-13 VITALS — Ht 66.0 in | Wt 165.0 lb

## 2021-05-13 DIAGNOSIS — M25532 Pain in left wrist: Secondary | ICD-10-CM | POA: Diagnosis not present

## 2021-05-13 NOTE — Progress Notes (Signed)
Office Visit Note   Patient: Laura Riddle           Date of Birth: 02-01-1935           MRN: [id number] Visit Date: 05/13/2021              Requested by: Shon Baton, [address] [place],   [id number] PCP: Shon Baton, MD   Assessment & Plan: Visit Diagnoses:  1. Pain in left wrist     Plan: Laura Riddle awoke Tuesday morning with swelling and some ecchymosis of her left wrist.  She denies any specific injury or trauma.  However the day before she was helping some people with repetitive motion of her wrist.  She has not had any numbness or tingling.  X-rays are negative.  She has a volar wrist splint with a thumb extension at home which I like her to use.  I suspect this is soft tissue in nature and will resolve slowly over the next few days.  If it does not like her to return or call  Follow-Up Instructions: Return if symptoms worsen or fail to improve.   Orders:  Orders Placed This Encounter  Procedures   XR Wrist Complete Left   No orders of the defined types were placed in this encounter.     Procedures: No procedures performed   Clinical Data: No additional findings.   Subjective: Chief Complaint  Patient presents with   Left Wrist - Pain  Patient presents today with left wrist pain. She states that she woke up Tuesday 05/11/2021 with pain and bruising in her left wrist. She states that there was no injury and no activity the day before. She has more localized tenderness laterally, just above her thumb. She said that she started to wear an old velcro wrist brace that she had from an injury before. She is taking Ibuprofen as needed. She is right hand dominant. She does have a history of a fracture to that left wrist a couple years ago.   HPI  Review of Systems   Objective: Vital Signs: Ht 5\' 6"  (1.676 m)   Wt 165 lb (74.8 kg)   BMI 26.63 kg/m   Physical Exam Constitutional:      Appearance: She is well-developed.  Eyes:     Pupils: Pupils are  equal, round, and reactive to light.  Pulmonary:     Effort: Pulmonary effort is normal.  Skin:    General: Skin is warm and dry.  Neurological:     Mental Status: She is alert and oriented to person, place, and time.  Psychiatric:        Behavior: Behavior normal.    Ortho Exam left nondominant wrist with ecchymosis on the volar surface but no significant pain.  There was mild swelling of the wrist and very minimal discomfort over the distal radius.  No pain over the ulna.  She actually had good range of motion actively and passively.  Neurologically intact.  No swelling of the digits or the dorsum of the hand.  Able to make a full fist and release.  Good opposition of thumb the little finger.  Good pulses  Specialty Comments:  No specialty comments available.  Imaging: XR Wrist Complete Left  Result Date: 05/13/2021 Films of the left wrist were obtained in several projections.  I did not see any evidence of a fracture.  Distal radius and ulna were intact.  Minimal degenerative change at the base of the thumb.  Carpus appeared to be intact.  No ectopic calcification.    PMFS History: Patient Active Problem List   Diagnosis Date Noted   Pain in left wrist 05/13/2021   Pain in right shoulder 02/24/2021   HYPERTENSION 11/10/2009   COLONIC POLYPS, HYPERPLASTIC 11/09/2009   HYPOTHYROIDISM 11/09/2009   HYPERLIPIDEMIA 11/09/2009   GERD 11/09/2009   HIATAL HERNIA 11/09/2009   IBS 11/09/2009   Past Medical History:  Diagnosis Date   Colon polyps    GERD (gastroesophageal reflux disease)    Hiatal hernia    Hyperlipidemia    Hypertension    Hypothyroidism    IBS (irritable bowel syndrome)     Family History  Problem Relation Age of Onset   Colon cancer Mother    Liver cancer Mother    Breast cancer Sister     Past Surgical History:  Procedure Laterality Date   ABDOMINAL HYSTERECTOMY     APPENDECTOMY     BACK SURGERY     BACK SURGERY     L5-S1   CATARACT EXTRACTION      CERVICAL FUSION     CHOLECYSTECTOMY     WRIST FRACTURE SURGERY     Social History   Occupational History   Occupation: nurse  Tobacco Use   Smoking status: Never   Smokeless tobacco: Never  Vaping Use   Vaping Use: Never used  Substance and Sexual Activity   Alcohol use: No    Alcohol/week: 0.0 standard drinks   Drug use: No   Sexual activity: Not on file

## 2021-05-27 ENCOUNTER — Encounter: Payer: Self-pay | Admitting: Orthopaedic Surgery

## 2021-05-27 ENCOUNTER — Other Ambulatory Visit: Payer: Self-pay

## 2021-05-27 ENCOUNTER — Ambulatory Visit (INDEPENDENT_AMBULATORY_CARE_PROVIDER_SITE_OTHER): Payer: Medicare Other | Admitting: Orthopaedic Surgery

## 2021-05-27 VITALS — Ht 66.0 in | Wt 165.0 lb

## 2021-05-27 DIAGNOSIS — M25532 Pain in left wrist: Secondary | ICD-10-CM

## 2021-05-27 MED ORDER — METHYLPREDNISOLONE 4 MG PO TBPK: ORAL_TABLET | ORAL | 0 refills | Status: DC

## 2021-05-27 NOTE — Progress Notes (Signed)
o  Office Visit Note   Patient: Laura Riddle           Date of Birth: 10-17-1935           MRN: [id number] Visit Date: 05/27/2021              Requested by: Shon Baton, [address] [place],  [place] [address] PCP: Shon Baton, MD   Assessment & Plan: Visit Diagnoses:  1. Pain in left wrist     Plan: Donyale texted me the other day and saw that she is still having a lot of trouble with her left wrist particularly at night trying to find a comfortable position.  Several weeks ago she was lifting a heavy bird house and thinks she may have injured her wrist but denies a specific injury or trauma.  She did have some ecchymosis along the volar ulnar aspect of her wrist on exam but x-rays were negative.  I reviewed them today and still did not see any particular abnormality about the radiocarpal joint or even the navicular.  The exam was benign as there was not any skin change or swelling or loss of motion.  There is no evidence of a carpal tunnel or triggering.  She had some tenderness dorsally across the wrist but without localization.  I will place her on a Medrol Dosepak and just monitor her course over the next week and if no improvement we might want to consider an MRI scan  Follow-Up Instructions: Return if symptoms worsen or fail to improve.   Orders:  No orders of the defined types were placed in this encounter.  Meds ordered this encounter  Medications   methylPREDNISolone (MEDROL DOSEPAK) 4 MG TBPK tablet    Sig: Take as directed on package.    Dispense:  21 tablet    Refill:  0      Procedures: No procedures performed   Clinical Data: No additional findings.   Subjective: Chief Complaint  Patient presents with   Left Wrist - Follow-up  Patient presents today for left wrist pain. She was here two weeks ago and was placed into a velcro wrist brace. She said that she only wore it for 24hours because it seemed to make it hurt more. She is taking Ibuprofen for  pain relief and it does not help much.  Has had difficulty wearing the splint.  Denies any numbness or tingling.  This been no skin change since the resolution of the ecchymosis.  No finger swelling or loss of motion of her hand or wrist  HPI  Review of Systems   Objective: Vital Signs: Ht 5\' 6"  (1.676 m)   Wt 165 lb (74.8 kg)   BMI 26.63 kg/m   Physical Exam Constitutional:      Appearance: She is well-developed.  Eyes:     Pupils: Pupils are equal, round, and reactive to light.  Pulmonary:     Effort: Pulmonary effort is normal.  Skin:    General: Skin is warm and dry.  Neurological:     Mental Status: She is alert and oriented to person, place, and time.  Psychiatric:        Behavior: Behavior normal.    Ortho Exam left wrist was not hot red warm or swollen.  No effusion about the wrist or ecchymosis.  Both ulnar and radial pulses intact.  Good capillary refill to fingers.  Full range of motion of her fingers without swelling.  Normal range of motion  of her wrist.  There was some mild tenderness across the dorsum of the wrist but no popping or clicking.  No pain at the base of the thumb.  Negative Tinel's over the median nerve.  The previously identified ecchymosis on the volar aspect of the wrist has resolved.  No elbow pain.  No pain over the first dorsal extensor compartment.  No masses.  No pain in the snuffbox  Specialty Comments:  No specialty comments available.  Imaging: No results found.   PMFS History: Patient Active Problem List   Diagnosis Date Noted   Pain in left wrist 05/13/2021   Pain in right shoulder 02/24/2021   HYPERTENSION 11/10/2009   COLONIC POLYPS, HYPERPLASTIC 11/09/2009   HYPOTHYROIDISM 11/09/2009   HYPERLIPIDEMIA 11/09/2009   GERD 11/09/2009   HIATAL HERNIA 11/09/2009   IBS 11/09/2009   Past Medical History:  Diagnosis Date   Colon polyps    GERD (gastroesophageal reflux disease)    Hiatal hernia    Hyperlipidemia    Hypertension     Hypothyroidism    IBS (irritable bowel syndrome)     Family History  Problem Relation Age of Onset   Colon cancer Mother    Liver cancer Mother    Breast cancer Sister     Past Surgical History:  Procedure Laterality Date   ABDOMINAL HYSTERECTOMY     APPENDECTOMY     BACK SURGERY     BACK SURGERY     L5-S1   CATARACT EXTRACTION     CERVICAL FUSION     CHOLECYSTECTOMY     WRIST FRACTURE SURGERY     Social History   Occupational History   Occupation: nurse  Tobacco Use   Smoking status: Never   Smokeless tobacco: Never  Vaping Use   Vaping Use: Never used  Substance and Sexual Activity   Alcohol use: No    Alcohol/week: 0.0 standard drinks   Drug use: No   Sexual activity: Not on file

## 2021-06-22 DIAGNOSIS — E785 Hyperlipidemia, unspecified: Secondary | ICD-10-CM | POA: Diagnosis not present

## 2021-06-22 DIAGNOSIS — R739 Hyperglycemia, unspecified: Secondary | ICD-10-CM | POA: Diagnosis not present

## 2021-06-22 DIAGNOSIS — E039 Hypothyroidism, unspecified: Secondary | ICD-10-CM | POA: Diagnosis not present

## 2021-06-22 DIAGNOSIS — M859 Disorder of bone density and structure, unspecified: Secondary | ICD-10-CM | POA: Diagnosis not present

## 2021-06-29 DIAGNOSIS — I10 Essential (primary) hypertension: Secondary | ICD-10-CM | POA: Diagnosis not present

## 2021-06-29 DIAGNOSIS — R82998 Other abnormal findings in urine: Secondary | ICD-10-CM | POA: Diagnosis not present

## 2021-08-04 DIAGNOSIS — Z23 Encounter for immunization: Secondary | ICD-10-CM | POA: Diagnosis not present

## 2021-08-12 DIAGNOSIS — R739 Hyperglycemia, unspecified: Secondary | ICD-10-CM | POA: Diagnosis not present

## 2021-09-24 ENCOUNTER — Telehealth: Payer: Self-pay

## 2021-09-24 NOTE — Telephone Encounter (Signed)
Pt called stating she would like to make an apt with Dr. Ernestina Patches please advise

## 2021-09-24 NOTE — Telephone Encounter (Signed)
Scheduled for 11/15

## 2021-09-24 NOTE — Telephone Encounter (Signed)
Right greater trochanteric bursa injection on 5/24. Ok to repeat if helped, same problem/side, and no new injury?

## 2021-10-11 DIAGNOSIS — Z23 Encounter for immunization: Secondary | ICD-10-CM | POA: Diagnosis not present

## 2021-10-12 ENCOUNTER — Ambulatory Visit: Payer: Self-pay

## 2021-10-12 ENCOUNTER — Other Ambulatory Visit: Payer: Self-pay

## 2021-10-12 ENCOUNTER — Ambulatory Visit (INDEPENDENT_AMBULATORY_CARE_PROVIDER_SITE_OTHER): Payer: Medicare Other | Admitting: Physical Medicine and Rehabilitation

## 2021-10-12 ENCOUNTER — Encounter: Payer: Self-pay | Admitting: Physical Medicine and Rehabilitation

## 2021-10-12 DIAGNOSIS — M7061 Trochanteric bursitis, right hip: Secondary | ICD-10-CM

## 2021-10-12 MED ORDER — LIDOCAINE HCL 2 % IJ SOLN
4.0000 mL | INTRAMUSCULAR | Status: AC | PRN
  Administered 2021-10-12: 4 mL

## 2021-10-12 MED ORDER — BUPIVACAINE HCL 0.25 % IJ SOLN
4.0000 mL | INTRAMUSCULAR | Status: AC | PRN
  Administered 2021-10-12: 4 mL via INTRA_ARTICULAR

## 2021-10-12 MED ORDER — TRIAMCINOLONE ACETONIDE 40 MG/ML IJ SUSP
60.0000 mg | INTRAMUSCULAR | Status: AC | PRN
  Administered 2021-10-12: 60 mg via INTRA_ARTICULAR

## 2021-10-12 NOTE — Progress Notes (Signed)
   [name] - 86 y.o. female MRN [id number]  Date of birth: 07-19-1935  Office Visit Note: Visit Date: 10/12/2021 PCP: Shon Baton, MD Referred by: Shon Baton, MD  Subjective: Chief Complaint  Patient presents with   Right Hip - Pain   HPI:  Laura Riddle is a 86 y.o. female who comes in today for planned repeat Right anesthetic greater trochanteric injection with fluoroscopic guidance.  The patient has failed conservative care including home exercise, medications, time and activity modification. Prior injection gave more than 50% relief for several months. This injection will be diagnostic and hopefully therapeutic.  Please see requesting physician notes for further details and justification. She is somewhat confused about hip injeciton versus greater trochanter injections and this was discussed. Also discussed stretches and foam roller for deep tissue work. May need to regroup with Dr. Durward Fortes if recurring and or PT.  Referring: Dr. Joni Fears   ROS Otherwise per HPI.  Assessment & Plan: Visit Diagnoses:    ICD-10-CM   1. Greater trochanteric bursitis, right  M70.61 Large Joint Inj: R greater trochanter    XR C-ARM NO REPORT      Plan: No additional findings.   Meds & Orders: No orders of the defined types were placed in this encounter.   Orders Placed This Encounter  Procedures   Large Joint Inj: R greater trochanter   XR C-ARM NO REPORT    Follow-up: Return for visit to requesting provider as needed.   Procedures: Large Joint Inj: R greater trochanter on 10/12/2021 1:10 PM Indications: pain and diagnostic evaluation Details: 22 G 3.5 in needle, fluoroscopy-guided lateral approach  Arthrogram: No  Medications: 4 mL lidocaine 2 %; 4 mL bupivacaine 0.25 %; 60 mg triamcinolone acetonide 40 MG/ML Outcome: tolerated well, no immediate complications  There was excellent flow of contrast outlined the greater trochanteric bursa without vascular  uptake. Procedure, treatment alternatives, risks and benefits explained, specific risks discussed. Consent was given by the patient. Immediately prior to procedure a time out was called to verify the correct patient, procedure, equipment, support staff and site/side marked as required. Patient was prepped and draped in the usual sterile fashion.         Clinical History: No specialty comments available.     Objective:  VS:  HT:    WT:   BMI:     BP:   HR: bpm  TEMP: ( )  RESP:  Physical Exam   Imaging: No results found.

## 2021-10-12 NOTE — Progress Notes (Signed)
Pt state right hip pain. Pt state walking and laying down makes the pain worse. Pt state she has to one step when walking due to her not wanting to put to much pressure on her right leg. Pt state she takes pain meds to help ease her pain.  Numeric Pain Rating Scale and Functional Assessment Average Pain 7   In the last MONTH (on 0-10 scale) has pain interfered with the following?  1. General activity like being  able to carry out your everyday physical activities such as walking, climbing stairs, carrying groceries, or moving a chair?  Rating(9)   -BT, -Dye Allergies.

## 2022-01-06 DIAGNOSIS — Z1212 Encounter for screening for malignant neoplasm of rectum: Secondary | ICD-10-CM | POA: Diagnosis not present

## 2022-01-11 DIAGNOSIS — E119 Type 2 diabetes mellitus without complications: Secondary | ICD-10-CM | POA: Diagnosis not present

## 2022-01-11 DIAGNOSIS — Z961 Presence of intraocular lens: Secondary | ICD-10-CM | POA: Diagnosis not present

## 2022-01-11 DIAGNOSIS — H52203 Unspecified astigmatism, bilateral: Secondary | ICD-10-CM | POA: Diagnosis not present

## 2022-04-01 ENCOUNTER — Emergency Department (HOSPITAL_BASED_OUTPATIENT_CLINIC_OR_DEPARTMENT_OTHER): Payer: Medicare Other

## 2022-04-01 ENCOUNTER — Encounter (HOSPITAL_BASED_OUTPATIENT_CLINIC_OR_DEPARTMENT_OTHER): Payer: Self-pay

## 2022-04-01 ENCOUNTER — Other Ambulatory Visit: Payer: Self-pay

## 2022-04-01 ENCOUNTER — Inpatient Hospital Stay (HOSPITAL_BASED_OUTPATIENT_CLINIC_OR_DEPARTMENT_OTHER)
Admission: EM | Admit: 2022-04-01 | Discharge: 2022-04-03 | DRG: 066 | Disposition: A | Discharge status: Skilled Nursing Facility | Payer: Medicare Other | Attending: Internal Medicine | Admitting: Internal Medicine

## 2022-04-01 DIAGNOSIS — E039 Hypothyroidism, unspecified: Secondary | ICD-10-CM

## 2022-04-01 DIAGNOSIS — Z66 Do not resuscitate: Secondary | ICD-10-CM | POA: Diagnosis not present

## 2022-04-01 DIAGNOSIS — E785 Hyperlipidemia, unspecified: Secondary | ICD-10-CM | POA: Diagnosis present

## 2022-04-01 DIAGNOSIS — R29818 Other symptoms and signs involving the nervous system: Secondary | ICD-10-CM | POA: Diagnosis not present

## 2022-04-01 DIAGNOSIS — Z9849 Cataract extraction status, unspecified eye: Secondary | ICD-10-CM | POA: Diagnosis not present

## 2022-04-01 DIAGNOSIS — R4781 Slurred speech: Secondary | ICD-10-CM | POA: Diagnosis not present

## 2022-04-01 DIAGNOSIS — Z880 Allergy status to penicillin: Secondary | ICD-10-CM | POA: Diagnosis not present

## 2022-04-01 DIAGNOSIS — Z888 Allergy status to other drugs, medicaments and biological substances status: Secondary | ICD-10-CM | POA: Diagnosis not present

## 2022-04-01 DIAGNOSIS — I6521 Occlusion and stenosis of right carotid artery: Secondary | ICD-10-CM | POA: Diagnosis not present

## 2022-04-01 DIAGNOSIS — K219 Gastro-esophageal reflux disease without esophagitis: Secondary | ICD-10-CM | POA: Diagnosis present

## 2022-04-01 DIAGNOSIS — Z79890 Hormone replacement therapy (postmenopausal): Secondary | ICD-10-CM | POA: Diagnosis not present

## 2022-04-01 DIAGNOSIS — E119 Type 2 diabetes mellitus without complications: Secondary | ICD-10-CM | POA: Diagnosis not present

## 2022-04-01 DIAGNOSIS — Z981 Arthrodesis status: Secondary | ICD-10-CM

## 2022-04-01 DIAGNOSIS — G43119 Migraine with aura, intractable, without status migrainosus: Secondary | ICD-10-CM

## 2022-04-01 DIAGNOSIS — Z8673 Personal history of transient ischemic attack (TIA), and cerebral infarction without residual deficits: Secondary | ICD-10-CM | POA: Diagnosis not present

## 2022-04-01 DIAGNOSIS — R29810 Facial weakness: Secondary | ICD-10-CM | POA: Diagnosis present

## 2022-04-01 DIAGNOSIS — Z79899 Other long term (current) drug therapy: Secondary | ICD-10-CM

## 2022-04-01 DIAGNOSIS — R29700 NIHSS score 0: Secondary | ICD-10-CM | POA: Diagnosis present

## 2022-04-01 DIAGNOSIS — Z885 Allergy status to narcotic agent status: Secondary | ICD-10-CM

## 2022-04-01 DIAGNOSIS — G43909 Migraine, unspecified, not intractable, without status migrainosus: Secondary | ICD-10-CM | POA: Diagnosis not present

## 2022-04-01 DIAGNOSIS — I639 Cerebral infarction, unspecified: Secondary | ICD-10-CM | POA: Diagnosis not present

## 2022-04-01 DIAGNOSIS — I10 Essential (primary) hypertension: Secondary | ICD-10-CM | POA: Diagnosis not present

## 2022-04-01 DIAGNOSIS — I6389 Other cerebral infarction: Secondary | ICD-10-CM | POA: Diagnosis not present

## 2022-04-01 DIAGNOSIS — I6381 Other cerebral infarction due to occlusion or stenosis of small artery: Secondary | ICD-10-CM | POA: Diagnosis not present

## 2022-04-01 LAB — DIFFERENTIAL
Abs Immature Granulocytes: 0.03 10*3/uL (ref 0.00–0.07)
Basophils Absolute: 0.1 10*3/uL (ref 0.0–0.1)
Basophils Relative: 1 %
Eosinophils Absolute: 0.2 10*3/uL (ref 0.0–0.5)
Eosinophils Relative: 2 %
Immature Granulocytes: 0 %
Lymphocytes Relative: 24 %
Lymphs Abs: 2.5 10*3/uL (ref 0.7–4.0)
Monocytes Absolute: 0.9 10*3/uL (ref 0.1–1.0)
Monocytes Relative: 9 %
Neutro Abs: 6.5 10*3/uL (ref 1.7–7.7)
Neutrophils Relative %: 64 %

## 2022-04-01 LAB — BASIC METABOLIC PANEL
Anion gap: 10 (ref 5–15)
BUN: 16 mg/dL (ref 8–23)
CO2: 25 mmol/L (ref 22–32)
Calcium: 9.6 mg/dL (ref 8.9–10.3)
Chloride: 105 mmol/L (ref 98–111)
Creatinine, Ser: 0.87 mg/dL (ref 0.44–1.00)
GFR, Estimated: >60 mL/min (ref >60)
Glucose, Bld: 133 mg/dL — ABNORMAL HIGH (ref 70–99)
Potassium: 4.1 mmol/L (ref 3.5–5.1)
Sodium: 140 mmol/L (ref 135–145)

## 2022-04-01 LAB — CBC
HCT: 40.8 % (ref 36.0–46.0)
Hemoglobin: 13.3 g/dL (ref 12.0–15.0)
MCH: 29.3 pg (ref 26.0–34.0)
MCHC: 32.6 g/dL (ref 30.0–36.0)
MCV: 89.9 fL (ref 80.0–100.0)
Platelets: 267 10*3/uL (ref 150–400)
RBC: 4.54 MIL/uL (ref 3.87–5.11)
RDW: 13.8 % (ref 11.5–15.5)
WBC: 10.1 10*3/uL (ref 4.0–10.5)
nRBC: 0 % (ref 0.0–0.2)

## 2022-04-01 LAB — CBG MONITORING, ED: Glucose-Capillary: 161 mg/dL — ABNORMAL HIGH (ref 70–99)

## 2022-04-01 LAB — ETHANOL: Alcohol, Ethyl (B): <10 mg/dL (ref <10)

## 2022-04-01 MED ORDER — ACETAMINOPHEN 325 MG PO TABS
650.0000 mg | ORAL_TABLET | Freq: Once | ORAL | Status: AC
  Administered 2022-04-01: 650 mg via ORAL
  Filled 2022-04-01: qty 2

## 2022-04-01 MED ORDER — ASPIRIN 325 MG PO TABS: 325.0000 mg | ORAL_TABLET | Freq: Every day | ORAL | Status: DC

## 2022-04-01 MED ORDER — ACETAMINOPHEN 650 MG RE SUPP: 650.0000 mg | RECTAL | Status: DC | PRN

## 2022-04-01 MED ORDER — ENOXAPARIN SODIUM 40 MG/0.4ML IJ SOSY
40.0000 mg | PREFILLED_SYRINGE | INTRAMUSCULAR | Status: DC
  Administered 2022-04-02 – 2022-04-03 (×2): 40 mg via SUBCUTANEOUS
  Filled 2022-04-01 (×2): qty 0.4

## 2022-04-01 MED ORDER — ACETAMINOPHEN 160 MG/5ML PO SOLN: 650.0000 mg | ORAL | Status: DC | PRN

## 2022-04-01 MED ORDER — ACETAMINOPHEN 325 MG PO TABS
650.0000 mg | ORAL_TABLET | ORAL | Status: DC | PRN
  Administered 2022-04-02 (×2): 650 mg via ORAL
  Filled 2022-04-01 (×2): qty 2

## 2022-04-01 MED ORDER — SENNOSIDES-DOCUSATE SODIUM 8.6-50 MG PO TABS: 1.0000 | ORAL_TABLET | Freq: Every evening | ORAL | Status: DC | PRN

## 2022-04-01 MED ORDER — ASPIRIN 300 MG RE SUPP: 300.0000 mg | Freq: Every day | RECTAL | Status: DC

## 2022-04-01 MED ORDER — PROCHLORPERAZINE EDISYLATE 10 MG/2ML IJ SOLN
5.0000 mg | Freq: Once | INTRAMUSCULAR | Status: AC
  Administered 2022-04-01: 5 mg via INTRAVENOUS
  Filled 2022-04-01: qty 2

## 2022-04-01 MED ORDER — SODIUM CHLORIDE 0.9 % IV SOLN: INTRAVENOUS | Status: DC

## 2022-04-01 MED ORDER — SODIUM CHLORIDE 0.9 % IV SOLN
100.0000 mL/h | INTRAVENOUS | Status: DC
  Administered 2022-04-01: 100 mL/h via INTRAVENOUS

## 2022-04-01 MED ORDER — SODIUM CHLORIDE 0.9 % IV BOLUS
500.0000 mL | Freq: Once | INTRAVENOUS | Status: AC
  Administered 2022-04-01: 500 mL via INTRAVENOUS

## 2022-04-01 MED ORDER — STROKE: EARLY STAGES OF RECOVERY BOOK
Freq: Once | Status: AC
  Filled 2022-04-01 (×2): qty 1

## 2022-04-01 MED ORDER — ASPIRIN 81 MG PO CHEW
324.0000 mg | CHEWABLE_TABLET | Freq: Once | ORAL | Status: AC
Start: 2022-04-01 — End: 2022-04-01
  Administered 2022-04-01: 324 mg via ORAL
  Filled 2022-04-01: qty 4

## 2022-04-01 NOTE — Progress Notes (Signed)
Plan of Care Note for accepted transfer ? ? ?Patient: Laura Riddle MRN: [id number]   [place]: 04/01/2022 ? 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 Around 11pm husband though she had facial droop and slurred speech but she just went to sleep. This morning woke up with continuous dizziness and unsteady gait.  ? ?CT head reveals new hypodensity within the right thalamus, concerning for acute infarct although no neurological deficit found on exam. Other labs are unremarkable. Not on antiplatelets.  ? ?Transfer for MRI brain and further workup as well as neuro consult.  ? ?Plan of care: ?The patient is accepted for admission to Telemetry unit, at [facility]..  ?ED physician to continue care of patient while remaining in the ED. ? ?Author: Orene Desanctis, DO ?04/01/2022 ? ?Check [email or web address] for on-call coverage. ? ?Nursing staff, Please call [place] number on Amion as soon as patient's arrival, so appropriate admitting provider can evaluate the pt. ?

## 2022-04-01 NOTE — ED Notes (Signed)
Patient transported to CT 

## 2022-04-01 NOTE — ED Provider Notes (Addendum)
?[facility] EMERGENC[facility] DEPT ?Provider Note ? ? ?CSN: [id number] ?Arrival date & time: 04/01/22  1429 ? ?  ? ?History ? ?Chief Complaint  ?Patient presents with  ? Dizziness  ? ? ?Laura Riddle is a 87 y.o. female. ? ? ?Dizziness ? ?Patient presents to the ED for an episode of dizziness.  Patient states symptoms started maybe around 9 PM last evening.  She went to go rest on the couch.  Patient noticed that she started to feel very fatigued and dizzy.  Patient's husband went to wake her up a couple hours later and he thought her speech was somewhat slurred.  He thought she might of had some drooping on the right side of her face.  Patient has continued to have a mild headache ever since then.  When she went to get up this morning she felt dizzy and lightheaded and off-balance.  She called her doctor who told her to come to be evaluated for possible stroke.  Patient is not having any trouble with her speech now.  She does have a history of migraines and has a very mild headache but does not feel like she has her typical aura.  She does have some pressure sensation behind her eyes.  Patient was able to walk into the ED but she did use a cane ? ?Home Medications ?Prior to Admission medications   ?Medication Sig Start Date End Date Taking? Authorizing Provider  ?amitriptyline (ELAVIL) 50 MG tablet Take 50 mg by mouth at bedtime.    [provider]  ?Ascorbic Acid (VITAMIN C) 1000 MG tablet Take 1,000 mg by mouth daily.    [provider]  ?atorvastatin (LIPITOR) 20 MG tablet Take 20 mg by mouth daily.    [provider]  ?Cholecalciferol (VITAMIN D3) 2000 UNITS TABS Take 2 tablets by mouth daily.    [provider]  ?clobetasol (TEMOVATE) 0.05 % external solution APPLY  1 MILLILITER TO AFFECTED AREA ON THE SCALP QD FOR 10 DAYS 06/12/18   [provider]  ?diazepam (VALIUM) 5 MG tablet TK 1/2 TO 1 T PO Q 12 H PRF HA 06/19/18   [provider]  ?diclofenac  sodium (VOLTAREN) 1 % GEL Apply 2-4 g topically 4 (four) times daily. 12/21/17   Cherylann Ratel, PA-C  ?HYDROcodone-acetaminophen (NORCO) 10-325 MG tablet TK 1 TO 2 TS PO Q 6 H PRN 06/19/18   [provider]  ?levothyroxine (SYNTHROID, LEVOTHROID) 112 MCG tablet Take 112 mcg by mouth daily before breakfast.    [provider]  ?loratadine (CLARITIN) 10 MG tablet Take 10 mg by mouth daily.    [provider]  ?losartan (COZAAR) 100 MG tablet Take 100 mg by mouth daily.    [provider]  ?methocarbamol (ROBAXIN) 500 MG tablet Take 1 tablet (500 mg total) by mouth 2 (two) times daily as needed for muscle spasms. 04/21/17   Garald Balding, MD  ?methylPREDNISolone (MEDROL DOSEPAK) 4 MG TBPK tablet Take as directed on package. 05/27/21   Garald Balding, MD  ?Multiple Vitamin (MULTIVITAMIN) tablet Take 1 tablet by mouth daily.    [provider]  ?olmesartan (BENICAR) 20 MG tablet Take 20 mg by mouth daily.    [provider]  ?omeprazole (PRILOSEC) 20 MG capsule Take 20 mg by mouth daily.    [provider]  ?zolpidem (AMBIEN) 10 MG tablet Take 10 mg by mouth at bedtime as needed for sleep.    [provider]  ?   ? ?Allergies    ?Morphine and Nitrofurantoin   ? ?Review of Systems   ?Review of Systems  ?Constitutional:  Negative for fever.  ?Neurological:  Positive for dizziness.  ? ?Physical Exam ?Updated Vital Signs ?BP (!) 141/51   Pulse (!) 54   Temp 98.3 ?F (36.8 ?C)   Resp 18   Ht 1.676 m ('5\' 6"'$ )   Wt 74.8 kg   SpO2 92%   BMI 26.62 kg/m?  ?Physical Exam ?Vitals and nursing note reviewed.  ?Constitutional:   ?   General: She is not in acute distress. ?   Appearance: She is well-developed.  ?HENT:  ?   Head: Normocephalic and atraumatic.  ?   Right Ear: External ear normal.  ?   Left Ear: External ear normal.  ?Eyes:  ?   General: No scleral icterus.    ?   Right eye: No discharge.     ?   Left eye: No discharge.  ?    Conjunctiva/sclera: Conjunctivae normal.  ?Neck:  ?   Trachea: No tracheal deviation.  ?Cardiovascular:  ?   Rate and Rhythm: Normal rate and regular rhythm.  ?Pulmonary:  ?   Effort: Pulmonary effort is normal. No respiratory distress.  ?   Breath sounds: Normal breath sounds. No stridor. No wheezing or rales.  ?Abdominal:  ?   General: Bowel sounds are normal. There is no distension.  ?   Palpations: Abdomen is soft.  ?   Tenderness: There is no abdominal tenderness. There is no guarding or rebound.  ?Musculoskeletal:     ?   General: No tenderness.  ?   Cervical back: Neck supple.  ?Skin: ?   General: Skin is warm and dry.  ?   Findings: No rash.  ?Neurological:  ?   Mental Status: She is alert and oriented to person, place, and time.  ?   Cranial Nerves: No cranial nerve deficit.  ?   Sensory: No sensory deficit.  ?   Motor: No abnormal muscle tone or seizure activity.  ?   Coordination: Coordination normal.  ?   Comments: No pronator drift bilateral upper extrem, able to hold both legs off bed for 5 seconds, sensation intact in all extremities, no visual field cuts, no left or right sided neglect, normal finger-nose exam bilaterally, no nystagmus noted ? ?No facial droop, extraocular movements intact, tongue midline  ? ? ?ED Results / Procedures / Treatments   ?Labs ?(all labs ordered are listed, but only abnormal results are displayed) ?Labs Reviewed  ?BASIC METABOLIC PANEL - Abnormal; Notable for the following components:  ?    Result Value  ? Glucose, Bld 133 (*)   ? All other components within normal limits  ?CBG MONITORING, ED - Abnormal; Notable for the following components:  ? Glucose-Capillary 161 (*)   ? All other components within normal limits  ?CBC  ?ETHANOL  ?DIFFERENTIAL  ? ? ?EKG ?EKG Interpretation ? ?Date/Time:  Friday Apr 01 2022 14:56:45 EDT ?Ventricular Rate:  60 ?PR Interval:  139 ?QRS Duration: 92 ?QT Interval:  412 ?QTC Calculation: 412 ?R Axis:   69 ?Text Interpretation: Sinus rhythm  Abnormal R-wave progression, early transition Borderline repolarization abnormality Confirmed by Dorie Rank [phone]) on 04/01/2022 3:34:56 PM ? ?Radiology ?CT HEAD WO CONTRAST ? ?Result Date: 04/01/2022 ?CLINICAL DATA:  Neuro deficit, acute, stroke suspected EXAM: CT HEAD WITHOUT CONTRAST TECHNIQUE: Contiguous axial images were obtained from the base of the skull  through the vertex without intravenous contrast. RADIATION DOSE REDUCTION: This exam was performed according to the departmental dose-optimization program which includes automated exposure control, adjustment of the mA and/or kV according to patient size and/or use of iterative reconstruction technique. COMPARISON:  CT head October 23, 2006. FINDINGS: Brain: New hypodensity within the right thalamus, concerning for acute infarct. No evidence of acute hemorrhage, hydrocephalus, extra-axial collection or mass lesion/mass effect. Mild patchy white matter hypodensities, nonspecific but compatible with mild chronic microvascular ischemic disease. Mild cerebral atrophy. Vascular: No hyperdense vessel identified. Skull: No acute fracture. Sinuses/Orbits: Clear sinuses.  No acute orbital findings. Other: No mastoid effusions IMPRESSION: New hypodensity within the right thalamus, concerning for acute infarct. Consider MRI to confirm and further assessed. Electronically Signed   By: Margaretha Sheffield M.D.   On: 04/01/2022 16:33   ? ?Procedures ?Procedures  ? ? ?Medications Ordered in ED ?Medications  ?sodium chloride 0.9 % bolus 500 mL (0 mLs Intravenous Stopped 04/01/22 1743)  ?  Followed by  ?0.9 %  sodium chloride infusion (100 mL/hr Intravenous New Bag/Given 04/01/22 1805)  ?aspirin chewable tablet 324 mg (has no administration in time range)  ?acetaminophen (TYLENOL) tablet 650 mg (650 mg Oral Given 04/01/22 1633)  ?prochlorperazine (COMPAZINE) injection 5 mg (5 mg Intravenous Given 04/01/22 1632)  ? ? ?ED Course/ Medical Decision Making/ A&P ?Clinical Course as of 04/01/22  1826  ?Fri Apr 01, 2022  ?1716 CT HEAD WO CONTRAST ?Head CT concerning for stroke [JK]  ?1733 CBC ?Normal [JK]  ?8413 Basic metabolic panel(!) ?Metabolic panel with hyperglycemia [JK]  ?2440 Case discussed with Dr. Sherrian Divers [JK]

## 2022-04-01 NOTE — ED Triage Notes (Signed)
Patient here POV from Home. ? ?Endorses Dizziness since 2100 last PM. Occurred when Patient was at Rest on [place]. Constant since it began. ? ?History of Migraines. Having an Aura Currently. Mild Headache. Family endorses Patient had Slurred Speech at that Time.  ? ?Sent by PCP for Evaluation of Same. No Obvious Neurological Deficits.  ? ?NAD Noted during Triage. A&Ox4. GCS 15. BIB Wheelchair. ?

## 2022-04-01 NOTE — Progress Notes (Addendum)
Pt arrived to the floor, CHG completed, pt hooked up to tele, V/S taken, bed alarm on and pt educated on fall risks skin assessed with [place]. ?

## 2022-04-01 NOTE — ED Notes (Signed)
Called [facility] to transport patient to [place] room# 34 ?

## 2022-04-01 NOTE — H&P (Signed)
?History and Physical  ? ? ?Laura Riddle IRC:[id number] DOB: 10-Nov-1935 DOA: 04/01/2022 ? ?PCP: Shon Baton, MD  ? ?Patient coming from: Home  ? ?Chief Complaint: Dizzy, slurred speech  ? ?HPI: Laura Riddle is a pleasant 87 y.o. female with medical history significant for hypertension, hyperlipidemia, hypothyroidism, and chronic migraine, now presenting to the emergency department for evaluation of dizziness.  The patient reports that she was in her usual state of health when she was laying on her couch and watching TV last night, this did up at approximately 9:30 PM, and noted that she was having difficulty with balance described as falling to the left.  Family noted that she had slurred speech and possibly left facial droop at that time.  She has had a mild headache since then but has not appreciated any focal numbness or weakness.  She has never experienced these symptoms previously.  She continued to experience imbalance today and called her PCP who recommended that she seek further evaluation in the ED.  She denies any recent fever, chills, chest pain, palpitations, cough, or shortness of breath. ? ?[place] ED Course: Upon arrival to the ED, patient is found to be afebrile and saturating well on room air with stable blood pressure.  EKG features sinus rhythm and head CT reveals new hypodensity in the right thalamus concerning for acute infarction.  Chemistry panel and CBC are unremarkable.  Patient was given 324 mg of aspirin, 500 cc of saline, acetaminophen, and Compazine in the ED.  She was transferred to [facility] for further evaluation and management. ? ?Review of Systems:  ?All other systems reviewed and apart from HPI, are negative. ? ?Past Medical History:  ?Diagnosis Date  ? Colon polyps   ? GERD (gastroesophageal reflux disease)   ? Hiatal hernia   ? Hyperlipidemia   ? Hypertension   ? Hypothyroidism   ? IBS (irritable bowel syndrome)   ? ? ?Past Surgical History:   ?Procedure Laterality Date  ? ABDOMINAL HYSTERECTOMY    ? APPENDECTOMY    ? BACK SURGERY    ? BACK SURGERY    ? L5-S1  ? CATARACT EXTRACTION    ? CERVICAL FUSION    ? CHOLECYSTECTOMY    ? WRIST FRACTURE SURGERY    ? ? ?Social History:  ? reports that she has never smoked. She has never used smokeless tobacco. She reports that she does not drink alcohol and does not use drugs. ? ?Allergies  ?Allergen Reactions  ? Morphine   ? Nitrofurantoin   ? ? ?Family History  ?Problem Relation Age of Onset  ? Colon cancer Mother   ? Liver cancer Mother   ? Breast cancer Sister   ? ? ? ?Prior to Admission medications   ?Medication Sig Start Date End Date Taking? Authorizing Provider  ?amitriptyline (ELAVIL) 50 MG tablet Take 50 mg by mouth at bedtime.    [provider]  ?Ascorbic Acid (VITAMIN C) 1000 MG tablet Take 1,000 mg by mouth daily.    [provider]  ?atorvastatin (LIPITOR) 20 MG tablet Take 20 mg by mouth daily.    [provider]  ?Cholecalciferol (VITAMIN D3) 2000 UNITS TABS Take 2 tablets by mouth daily.    [provider]  ?clobetasol (TEMOVATE) 0.05 % external solution APPLY  1 MILLILITER TO AFFECTED AREA ON THE SCALP QD FOR 10 DAYS 06/12/18   [provider]  ?diazepam (VALIUM) 5 MG tablet TK 1/2 TO 1 T PO  Q 12 H PRF HA 06/19/18   [provider]  ?diclofenac sodium (VOLTAREN) 1 % GEL Apply 2-4 g topically 4 (four) times daily. 12/21/17   Cherylann Ratel, PA-C  ?HYDROcodone-acetaminophen (NORCO) 10-325 MG tablet TK 1 TO 2 TS PO Q 6 H PRN 06/19/18   [provider]  ?levothyroxine (SYNTHROID, LEVOTHROID) 112 MCG tablet Take 112 mcg by mouth daily before breakfast.    [provider]  ?loratadine (CLARITIN) 10 MG tablet Take 10 mg by mouth daily.    [provider]  ?losartan (COZAAR) 100 MG tablet Take 100 mg by mouth daily.    [provider]  ?methocarbamol (ROBAXIN) 500 MG tablet Take 1 tablet (500 mg total) by mouth 2  (two) times daily as needed for muscle spasms. 04/21/17   Garald Balding, MD  ?methylPREDNISolone (MEDROL DOSEPAK) 4 MG TBPK tablet Take as directed on package. 05/27/21   Garald Balding, MD  ?Multiple Vitamin (MULTIVITAMIN) tablet Take 1 tablet by mouth daily.    [provider]  ?olmesartan (BENICAR) 20 MG tablet Take 20 mg by mouth daily.    [provider]  ?omeprazole (PRILOSEC) 20 MG capsule Take 20 mg by mouth daily.    [provider]  ?zolpidem (AMBIEN) 10 MG tablet Take 10 mg by mouth at bedtime as needed for sleep.    [provider]  ? ? ?Physical Exam: ?Vitals:  ? 04/01/22 2000 04/01/22 2030 04/01/22 2159 04/01/22 2340  ?BP: 128/61 (!) 124/52 (!) 147/57 (!) 132/56  ?Pulse: (!) 56 73 64 75  ?Resp: '17 18 18 12  '$ ?Temp:  97.7 ?F (36.5 ?C) 98.8 ?F (37.1 ?C)   ?TempSrc:  Oral Oral Oral  ?SpO2: 96% 94% 100% 95%  ?Weight:      ?Height:      ? ? ? ?Constitutional: NAD, calm  ?Eyes: PERTLA, lids and conjunctivae normal ?ENMT: Mucous membranes are moist. Posterior pharynx clear of any exudate or lesions.   ?Neck: supple, no masses  ?Respiratory: no wheezing, no crackles. No accessory muscle use.  ?Cardiovascular: S1 & S2 heard, regular rate and rhythm. No extremity edema.  ?Abdomen: No distension, no tenderness, soft. Bowel sounds active.  ?Musculoskeletal: no clubbing / cyanosis. No joint deformity upper and lower extremities.   ?Skin: no significant rashes, lesions, ulcers. Warm, dry, well-perfused. ?Neurologic: CN 2-12 grossly intact. Sensation to light touch intact. Strength 5/5 in all 4 limbs. Alert and oriented.  ?Psychiatric: Very pleasant. Cooperative.  ? ? ?Labs and Imaging on Admission: I have personally reviewed following labs and imaging studies ? ?CBC: ?Recent Labs  ?Lab 04/01/22 ?1611  ?WBC 10.1  ?NEUTROABS 6.5  ?HGB 13.3  ?HCT 40.8  ?MCV 89.9  ?PLT 267  ? ?Basic Metabolic Panel: ?Recent Labs  ?Lab 04/01/22 ?1611  ?NA 140  ?K 4.1  ?CL 105  ?CO2 25  ?GLUCOSE  133*  ?BUN 16  ?CREATININE 0.87  ?CALCIUM 9.6  ? ?GFR: ?Estimated Creatinine Clearance: 49.8 mL/min (by C-G formula based on SCr of 0.87 mg/dL). ?Liver Function Tests: ?No results for input(s): AST, ALT, ALKPHOS, BILITOT, PROT, ALBUMIN in the last 168 hours. ?No results for input(s): LIPASE, AMYLASE in the last 168 hours. ?No results for input(s): AMMONIA in the last 168 hours. ?Coagulation Profile: ?No results for input(s): INR, PROTIME in the last 168 hours. ?Cardiac Enzymes: ?No results for input(s): CKTOTAL, CKMB, CKMBINDEX, TROPONINI in the last 168 hours. ?BNP (last 3 results) ?No results for input(s): PROBNP in  the last 8760 hours. ?HbA1C: ?No results for input(s): HGBA1C in the last 72 hours. ?CBG: ?Recent Labs  ?Lab 04/01/22 ?1448  ?GLUCAP 161*  ? ?Lipid Profile: ?No results for input(s): CHOL, HDL, LDLCALC, TRIG, CHOLHDL, LDLDIRECT in the last 72 hours. ?Thyroid Function Tests: ?No results for input(s): TSH, T4TOTAL, FREET4, T3FREE, THYROIDAB in the last 72 hours. ?Anemia Panel: ?No results for input(s): VITAMINB12, FOLATE, FERRITIN, TIBC, IRON, RETICCTPCT in the last 72 hours. ?Urine analysis: ?No results found for: COLORURINE, APPEARANCEUR, [place], [place], [place], [place], [place], KETONESUR, PROTEINUR, [place], NITRITE, LEUKOCYTESUR ?Sepsis Labs: ?'@LABRCNTIP'$ (procalcitonin:4,lacticidven:4) ?)No results found for this or any previous visit (from the past 240 hour(s)).  ? ?Radiological Exams on Admission: ?CT HEAD WO CONTRAST ? ?Result Date: 04/01/2022 ?CLINICAL DATA:  Neuro deficit, acute, stroke suspected EXAM: CT HEAD WITHOUT CONTRAST TECHNIQUE: Contiguous axial images were obtained from the base of the skull through the vertex without intravenous contrast. RADIATION DOSE REDUCTION: This exam was performed according to the departmental dose-optimization program which includes automated exposure control, adjustment of the mA and/or kV according to patient size and/or use of iterative  reconstruction technique. COMPARISON:  CT head October 23, 2006. FINDINGS: Brain: New hypodensity within the right thalamus, concerning for acute infarct. No evidence of acute hemorrhage, hydrocephalus, extra-axial collection

## 2022-04-01 NOTE — ED Notes (Signed)
[facility] arrived to transport pt. Pt stable at time of departure. Report given to [place] with [facility]. ?

## 2022-04-01 NOTE — Consult Note (Addendum)
?                    NEURO HOSPITALIST CONSULT NOTE  ? ?Requestig physician: Dr. Myna Hidalgo ? ?Reason for Consult: New right thalamic hypodensity seen on CT ? ?History obtained from:   Patient and Chart    ? ?HPI:                                                                                                                                         ? Laura Riddle is an 87 y.o. female with a history of migraines, HLD, HTN and hypothyroidism who initially presented to the [place] ED on Friday afternoon for evaluation of dizziness which had started on Thursday night at 2100. The patient was at rest on a couch when symptoms first occurred. The patient had decided to stay at home and went to sleep, but on awakening the next morning she still felt dizzy as well as lightheaded and off-balance. When she arrived to the ED she was also complaining of a migrainous aura and mild headache, the latter having started the night before. Family endorsed patient having slurred speech and right facial drooping as well, also first noticed the night before.   ? ?CT head in the ED revealed a small right thalamic hypodensity, concerning for a subacute right thalamic lacunar infarction. She was transferred to [facility] for stroke work up.  ? ?Past Medical History:  ?Diagnosis Date  ? Colon polyps   ? GERD (gastroesophageal reflux disease)   ? Hiatal hernia   ? Hyperlipidemia   ? Hypertension   ? Hypothyroidism   ? IBS (irritable bowel syndrome)   ? ? ?Past Surgical History:  ?Procedure Laterality Date  ? ABDOMINAL HYSTERECTOMY    ? APPENDECTOMY    ? BACK SURGERY    ? BACK SURGERY    ? L5-S1  ? CATARACT EXTRACTION    ? CERVICAL FUSION    ? CHOLECYSTECTOMY    ? WRIST FRACTURE SURGERY    ? ? ?Family History  ?Problem Relation Age of Onset  ? Colon cancer Mother   ? Liver cancer Mother   ? Breast cancer Sister   ?          ? ?Social History:  reports that she has never smoked. She has never used smokeless tobacco. She reports that she does  not drink alcohol and does not use drugs. ? ?Allergies  ?Allergen Reactions  ? Morphine   ? Nitrofurantoin   ? ? ?MEDICATIONS:                                                                                                                     ?  No current facility-administered medications on file prior to encounter.  ? ?Current Outpatient Medications on File Prior to Encounter  ?Medication Sig Dispense Refill  ? amitriptyline (ELAVIL) 50 MG tablet Take 50 mg by mouth at bedtime.    ? Ascorbic Acid (VITAMIN C) 1000 MG tablet Take 1,000 mg by mouth daily.    ? atorvastatin (LIPITOR) 20 MG tablet Take 20 mg by mouth daily.    ? Cholecalciferol (VITAMIN D3) 2000 UNITS TABS Take 2 tablets by mouth daily.    ? clobetasol (TEMOVATE) 0.05 % external solution APPLY  1 MILLILITER TO AFFECTED AREA ON THE SCALP QD FOR 10 DAYS  3  ? diazepam (VALIUM) 5 MG tablet TK 1/2 TO 1 T PO Q 12 H PRF HA  0  ? diclofenac sodium (VOLTAREN) 1 % GEL Apply 2-4 g topically 4 (four) times daily. 5 Tube 5  ? HYDROcodone-acetaminophen (NORCO) 10-325 MG tablet TK 1 TO 2 TS PO Q 6 H PRN  0  ? levothyroxine (SYNTHROID, LEVOTHROID) 112 MCG tablet Take 112 mcg by mouth daily before breakfast.    ? loratadine (CLARITIN) 10 MG tablet Take 10 mg by mouth daily.    ? losartan (COZAAR) 100 MG tablet Take 100 mg by mouth daily.    ? methocarbamol (ROBAXIN) 500 MG tablet Take 1 tablet (500 mg total) by mouth 2 (two) times daily as needed for muscle spasms. 30 tablet 0  ? methylPREDNISolone (MEDROL DOSEPAK) 4 MG TBPK tablet Take as directed on package. 21 tablet 0  ? Multiple Vitamin (MULTIVITAMIN) tablet Take 1 tablet by mouth daily.    ? olmesartan (BENICAR) 20 MG tablet Take 20 mg by mouth daily.    ? omeprazole (PRILOSEC) 20 MG capsule Take 20 mg by mouth daily.    ? zolpidem (AMBIEN) 10 MG tablet Take 10 mg by mouth at bedtime as needed for sleep.    ? ? ?Scheduled: ?  stroke: early stages of recovery book   Does not apply Once  ? aspirin  300 mg Rectal  Daily  ? Or  ? aspirin  325 mg Oral Daily  ? enoxaparin (LOVENOX) injection  40 mg Subcutaneous Q24H  ? ? ?ROS:                                                                                                                                       ?Continues to have a mild headache and light sensitivity. Does not feel weak. No vision loss. No confusion, dysphasia or dysarthria.  ? ? ?Blood pressure (!) 132/56, pulse 75, temperature 98.8 ?F (37.1 ?C), temperature source Oral, resp. rate 12, height '5\' 6"'$  (1.676 m), weight 74.8 kg, SpO2 95 %. ? ? ?General Examination:                                                                                                      ? ?  Physical Exam  ?HEENT-  [place]/AT    ?Lungs- Respirations unlabored ?Extremities- No edema ? ?Neurological Examination ?Mental Status: Alert, oriented x 5, thought content appropriate.  Speech fluent without evidence of aphasia.  Able to follow all commands without difficulty. ?Cranial Nerves: ?II: Temporal visual fields intact with no extinction to DSS. PERRL  ?III,IV, VI: No ptosis. EOMI.  ?V: Temp sensation equal bilaterally  ?VII: Smile symmetric ?VIII: Hearing intact to voice ?IX,X: No hoarseness ?XI: Symmetric shoulder shrug ?XII: Midline tongue extension ?Motor: ?BUE 5/5 proximally and distally, except for 4/5 left grip strength ?BLE 5/5 proximally and distally  ?No pronator drift.  ?Sensory: Temp and light touch intact throughout, bilaterally, except for decreased temp sensation to LLE. No extinction to DSS.  ?Deep Tendon Reflexes: 2+ and symmetric throughout ?Cerebellar: No ataxia with FNF bilaterally  ?Gait: Deferred ?  ?Lab Results: ?Basic Metabolic Panel: ?Recent Labs  ?Lab 04/01/22 ?1611  ?NA 140  ?K 4.1  ?CL 105  ?CO2 25  ?GLUCOSE 133*  ?BUN 16  ?CREATININE 0.87  ?CALCIUM 9.6  ? ? ?CBC: ?Recent Labs  ?Lab 04/01/22 ?1611  ?WBC 10.1  ?NEUTROABS 6.5  ?HGB 13.3  ?HCT 40.8  ?MCV 89.9  ?PLT 267  ? ? ?Cardiac Enzymes: ?No results for input(s): CKTOTAL,  CKMB, CKMBINDEX, TROPONINI in the last 168 hours. ? ?Lipid Panel: ?No results for input(s): CHOL, TRIG, HDL, CHOLHDL, VLDL, LDLCALC in the last 168 hours. ? ?Imaging: ?CT HEAD WO CONTRAST ? ?Result Date: 04/01/2022 ?CLINICAL DATA:  Neuro deficit, acute, stroke suspected EXAM: CT HEAD WITHOUT CONTRAST TECHNIQUE: Contiguous axial images were obtained from the base of the skull through the vertex without intravenous contrast. RADIATION DOSE REDUCTION: This exam was performed according to the departmental dose-optimization program which includes automated exposure control, adjustment of the mA and/or kV according to patient size and/or use of iterative reconstruction technique. COMPARISON:  CT head October 23, 2006. FINDINGS: Brain: New hypodensity within the right thalamus, concerning for acute infarct. No evidence of acute hemorrhage, hydrocephalus, extra-axial collection or mass lesion/mass effect. Mild patchy white matter hypodensities, nonspecific but compatible with mild chronic microvascular ischemic disease. Mild cerebral atrophy. Vascular: No hyperdense vessel identified. Skull: No acute fracture. Sinuses/Orbits: Clear sinuses.  No acute orbital findings. Other: No mastoid effusions IMPRESSION: New hypodensity within the right thalamus, concerning for acute infarct. Consider MRI to confirm and further assessed. Electronically Signed   By: Margaretha Sheffield M.D.   On: 04/01/2022 16:33   ? ? ?Assessment: 87 year old female with small right thalamic hypodensity on CT, new from prior scan performed in 2007, most likely representing a subacute lacunar infarction.  ?1. Exam reveals mildly decreased left grip strength and decreased temp sensation to LLE. Otherwise negative.  ?2. EKG: Sinus rhythm; abnormal R-wave progression, early transition; borderline repolarization abnormality ?3. Electrolytes with normal renal function. Na, K and Ca normal. CBC normal.   ? ?Recommendations: ?1. MRI brain ?2. TTE ?3. CTA of head  and neck ?4. Cardiac telemetry ?5. HgbA1c, fasting lipid panel ?6. PT consult, OT consult, Speech consult ?7. Agree with starting ASA ?8. BP management per standard protocol. Out of the permissive HTN ti

## 2022-04-02 ENCOUNTER — Observation Stay (HOSPITAL_COMMUNITY): Payer: Medicare Other

## 2022-04-02 DIAGNOSIS — G43909 Migraine, unspecified, not intractable, without status migrainosus: Secondary | ICD-10-CM | POA: Diagnosis present

## 2022-04-02 DIAGNOSIS — I639 Cerebral infarction, unspecified: Secondary | ICD-10-CM | POA: Diagnosis present

## 2022-04-02 DIAGNOSIS — Z79890 Hormone replacement therapy (postmenopausal): Secondary | ICD-10-CM | POA: Diagnosis not present

## 2022-04-02 DIAGNOSIS — I6521 Occlusion and stenosis of right carotid artery: Secondary | ICD-10-CM | POA: Diagnosis not present

## 2022-04-02 DIAGNOSIS — I6389 Other cerebral infarction: Secondary | ICD-10-CM

## 2022-04-02 DIAGNOSIS — Z79899 Other long term (current) drug therapy: Secondary | ICD-10-CM | POA: Diagnosis not present

## 2022-04-02 DIAGNOSIS — Z8673 Personal history of transient ischemic attack (TIA), and cerebral infarction without residual deficits: Secondary | ICD-10-CM | POA: Diagnosis not present

## 2022-04-02 DIAGNOSIS — R4781 Slurred speech: Secondary | ICD-10-CM | POA: Diagnosis present

## 2022-04-02 DIAGNOSIS — I10 Essential (primary) hypertension: Secondary | ICD-10-CM | POA: Diagnosis present

## 2022-04-02 DIAGNOSIS — R29810 Facial weakness: Secondary | ICD-10-CM | POA: Diagnosis present

## 2022-04-02 DIAGNOSIS — Z66 Do not resuscitate: Secondary | ICD-10-CM | POA: Diagnosis present

## 2022-04-02 DIAGNOSIS — E039 Hypothyroidism, unspecified: Secondary | ICD-10-CM | POA: Diagnosis present

## 2022-04-02 DIAGNOSIS — Z888 Allergy status to other drugs, medicaments and biological substances status: Secondary | ICD-10-CM | POA: Diagnosis not present

## 2022-04-02 DIAGNOSIS — K219 Gastro-esophageal reflux disease without esophagitis: Secondary | ICD-10-CM | POA: Diagnosis present

## 2022-04-02 DIAGNOSIS — R29818 Other symptoms and signs involving the nervous system: Secondary | ICD-10-CM | POA: Diagnosis not present

## 2022-04-02 DIAGNOSIS — Z9849 Cataract extraction status, unspecified eye: Secondary | ICD-10-CM | POA: Diagnosis not present

## 2022-04-02 DIAGNOSIS — E119 Type 2 diabetes mellitus without complications: Secondary | ICD-10-CM | POA: Diagnosis present

## 2022-04-02 DIAGNOSIS — Z880 Allergy status to penicillin: Secondary | ICD-10-CM | POA: Diagnosis not present

## 2022-04-02 DIAGNOSIS — Z885 Allergy status to narcotic agent status: Secondary | ICD-10-CM | POA: Diagnosis not present

## 2022-04-02 DIAGNOSIS — I6381 Other cerebral infarction due to occlusion or stenosis of small artery: Secondary | ICD-10-CM | POA: Diagnosis present

## 2022-04-02 DIAGNOSIS — Z981 Arthrodesis status: Secondary | ICD-10-CM | POA: Diagnosis not present

## 2022-04-02 DIAGNOSIS — R29700 NIHSS score 0: Secondary | ICD-10-CM | POA: Diagnosis present

## 2022-04-02 DIAGNOSIS — E785 Hyperlipidemia, unspecified: Secondary | ICD-10-CM | POA: Diagnosis present

## 2022-04-02 LAB — GLUCOSE, CAPILLARY
Glucose-Capillary: 137 mg/dL — ABNORMAL HIGH (ref 70–99)
Glucose-Capillary: 130 mg/dL — ABNORMAL HIGH (ref 70–99)
Glucose-Capillary: 166 mg/dL — ABNORMAL HIGH (ref 70–99)
Glucose-Capillary: 189 mg/dL — ABNORMAL HIGH (ref 70–99)

## 2022-04-02 LAB — LIPID PANEL
Cholesterol: 145 mg/dL (ref 0–200)
HDL: 43 mg/dL (ref >40)
LDL Cholesterol: 73 mg/dL (ref 0–99)
Total CHOL/HDL Ratio: 3.4 RATIO
Triglycerides: 143 mg/dL (ref <150)
VLDL: 29 mg/dL (ref 0–40)

## 2022-04-02 LAB — CBC
HCT: 38.6 % (ref 36.0–46.0)
Hemoglobin: 12.5 g/dL (ref 12.0–15.0)
MCH: 29.3 pg (ref 26.0–34.0)
MCHC: 32.4 g/dL (ref 30.0–36.0)
MCV: 90.4 fL (ref 80.0–100.0)
Platelets: 195 10*3/uL (ref 150–400)
RBC: 4.27 MIL/uL (ref 3.87–5.11)
RDW: 13.7 % (ref 11.5–15.5)
WBC: 9.2 10*3/uL (ref 4.0–10.5)
nRBC: 0 % (ref 0.0–0.2)

## 2022-04-02 LAB — ECHOCARDIOGRAM COMPLETE
AR max vel: 2.43 cm2
AV Area VTI: 2.59 cm2
AV Area mean vel: 2.25 cm2
AV Mean grad: 3 mmHg
AV Peak grad: 4.6 mmHg
Ao pk vel: 1.07 m/s
Area-P 1/2: 3.89 cm2
Height: 66 in
S' Lateral: 2.3 cm
Weight: 2638.47 oz

## 2022-04-02 LAB — HEMOGLOBIN A1C
Hgb A1c MFr Bld: 7.5 % — ABNORMAL HIGH (ref 4.8–5.6)
Mean Plasma Glucose: 168.55 mg/dL

## 2022-04-02 MED ORDER — INSULIN ASPART 100 UNIT/ML IJ SOLN
0.0000 [IU] | Freq: Three times a day (TID) | INTRAMUSCULAR | Status: DC
  Administered 2022-04-02 – 2022-04-03 (×2): 1 [IU] via SUBCUTANEOUS

## 2022-04-02 MED ORDER — ATORVASTATIN CALCIUM 40 MG PO TABS
40.0000 mg | ORAL_TABLET | Freq: Every day | ORAL | Status: DC
  Administered 2022-04-02 – 2022-04-03 (×2): 40 mg via ORAL
  Filled 2022-04-02 (×2): qty 1

## 2022-04-02 MED ORDER — ATORVASTATIN CALCIUM 10 MG PO TABS
20.0000 mg | ORAL_TABLET | Freq: Every day | ORAL | Status: DC
Start: 2022-04-02 — End: 2022-04-02

## 2022-04-02 MED ORDER — AMITRIPTYLINE HCL 50 MG PO TABS
50.0000 mg | ORAL_TABLET | Freq: Every day | ORAL | Status: DC
  Administered 2022-04-02 (×2): 50 mg via ORAL
  Filled 2022-04-02 (×2): qty 1

## 2022-04-02 MED ORDER — CLOPIDOGREL BISULFATE 75 MG PO TABS
75.0000 mg | ORAL_TABLET | Freq: Every day | ORAL | Status: DC
  Administered 2022-04-02 – 2022-04-03 (×2): 75 mg via ORAL
  Filled 2022-04-02 (×2): qty 1

## 2022-04-02 MED ORDER — DIAZEPAM 5 MG PO TABS: 2.5000 mg | ORAL_TABLET | Freq: Two times a day (BID) | ORAL | Status: DC | PRN

## 2022-04-02 MED ORDER — HYDROCODONE-ACETAMINOPHEN 10-325 MG PO TABS
1.0000 | ORAL_TABLET | Freq: Two times a day (BID) | ORAL | Status: DC | PRN
  Administered 2022-04-02: 1 via ORAL
  Filled 2022-04-02: qty 1

## 2022-04-02 MED ORDER — IOHEXOL 350 MG/ML SOLN
75.0000 mL | Freq: Once | INTRAVENOUS | Status: AC | PRN
  Administered 2022-04-02: 75 mL via INTRAVENOUS

## 2022-04-02 MED ORDER — ASPIRIN EC 81 MG PO TBEC
81.0000 mg | DELAYED_RELEASE_TABLET | Freq: Every day | ORAL | Status: DC
  Administered 2022-04-02 – 2022-04-03 (×2): 81 mg via ORAL
  Filled 2022-04-02 (×2): qty 1

## 2022-04-02 MED ORDER — LEVOTHYROXINE SODIUM 112 MCG PO TABS
112.0000 ug | ORAL_TABLET | Freq: Every day | ORAL | Status: DC
  Administered 2022-04-02 – 2022-04-03 (×2): 112 ug via ORAL
  Filled 2022-04-02 (×2): qty 1

## 2022-04-02 NOTE — Progress Notes (Addendum)
STROKE TEAM PROGRESS NOTE  ? ?INTERVAL HISTORY ?Her husband is at the bedside.  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 He was diagnosed with Alzheimer's 2 years ago and this has increased her stress. She has also noticed her BP systolic has been around 017-494 occasionally when she checks it. She is reporting occasional dizziness, she has not walked in the hallways yet. She would also like a referral for headache/migraine management as well.  ? ?Vitals:  ? 04/01/22 2340 04/02/22 0006 04/02/22 0305 04/02/22 0724  ?BP: (!) 132/56 (!) 115/53 (!) 157/56 (!) 152/51  ?Pulse: 75  60 65  ?Resp: '12  16 16  '$ ?Temp:   98.7 ?F (37.1 ?C)   ?TempSrc: Oral  Oral   ?SpO2: 95%  99%   ?Weight:      ?Height:      ? ?CBC:  ?Recent Labs  ?Lab 04/01/22 ?1611 04/02/22 ?0138  ?WBC 10.1 9.2  ?NEUTROABS 6.5  --   ?HGB 13.3 12.5  ?HCT 40.8 38.6  ?MCV 89.9 90.4  ?PLT 267 195  ? ?Basic Metabolic Panel:  ?Recent Labs  ?Lab 04/01/22 ?1611  ?NA 140  ?K 4.1  ?CL 105  ?CO2 25  ?GLUCOSE 133*  ?BUN 16  ?CREATININE 0.87  ?CALCIUM 9.6  ? ?Lipid Panel:  ?Recent Labs  ?Lab 04/02/22 ?0138  ?CHOL 145  ?TRIG 143  ?HDL 43  ?CHOLHDL 3.4  ?VLDL 29  ?[place] 73  ? ?HgbA1c:  ?Recent Labs  ?Lab 04/02/22 ?0138  ?HGBA1C 7.5*  ? ?Urine Drug Screen: No results for input(s): LABOPIA, COCAINSCRNUR, LABBENZ, AMPHETMU, THCU, LABBARB in the last 168 hours.  ?Alcohol Level  ?Recent Labs  ?Lab 04/01/22 ?1611  ?ETH <10  ? ? ?IMAGING past 24 hours ?CT ANGIO HEAD NECK W WO CM ? ?Result Date: 04/02/2022 ?CLINICAL DATA:  Stroke follow-up EXAM: CT ANGIOGRAPHY HEAD AND NECK TECHNIQUE: Multidetector CT imaging of the head and neck was performed using the standard protocol during bolus administration of intravenous contrast. Multiplanar CT image reconstructions and MIPs were obtained to evaluate the vascular anatomy. Carotid stenosis measurements (when applicable) are obtained utilizing NASCET criteria, using the distal internal carotid diameter as the denominator. RADIATION  DOSE REDUCTION: This exam was performed according to the departmental dose-optimization program which includes automated exposure control, adjustment of the mA and/or kV according to patient size and/or use of iterative reconstruction technique. CONTRAST:  45m OMNIPAQUE IOHEXOL 350 MG/ML SOLN COMPARISON:  None Available. FINDINGS: CTA NECK FINDINGS SKELETON: There is no bony spinal canal stenosis. No lytic or blastic lesion. OTHER NECK: Normal pharynx, larynx and major salivary glands. No cervical lymphadenopathy. Unremarkable thyroid gland. UPPER CHEST: No pneumothorax or pleural effusion. No nodules or masses. AORTIC ARCH: There is no calcific atherosclerosis of the aortic arch. There is no aneurysm, dissection or hemodynamically significant stenosis of the visualized portion of the aorta. Conventional 3 vessel aortic branching pattern. The visualized proximal subclavian arteries are widely patent. RIGHT CAROTID SYSTEM: No dissection, occlusion or aneurysm. Mild atherosclerotic calcification at the carotid bifurcation without hemodynamically significant stenosis. LEFT CAROTID SYSTEM: Normal without aneurysm, dissection or stenosis. VERTEBRAL ARTERIES: Left dominant configuration. Both origins are clearly patent. There is no dissection, occlusion or flow-limiting stenosis to the skull base (V1-V3 segments). CTA HEAD FINDINGS POSTERIOR CIRCULATION: --Vertebral arteries: Normal V4 segments. --Inferior cerebellar arteries: Normal. --Basilar artery: Normal. --Superior cerebellar arteries: Normal. --Posterior cerebral arteries (PCA): Normal. ANTERIOR CIRCULATION: --Intracranial internal carotid arteries: Normal. --Anterior cerebral arteries (ACA): Normal.  Both A1 segments are present. Patent anterior communicating artery (a-comm). --Middle cerebral arteries (MCA): Normal. VENOUS SINUSES: As permitted by contrast timing, patent. ANATOMIC VARIANTS: None Review of the MIP images confirms the above findings. IMPRESSION: 1.  No emergent large vessel occlusion or high-grade stenosis of the intracranial arteries. Electronically Signed   By: Ulyses Jarred M.D.   On: 04/02/2022 03:03  ? ?CT HEAD WO CONTRAST ? ?Result Date: 04/01/2022 ?CLINICAL DATA:  Neuro deficit, acute, stroke suspected EXAM: CT HEAD WITHOUT CONTRAST TECHNIQUE: Contiguous axial images were obtained from the base of the skull through the vertex without intravenous contrast. RADIATION DOSE REDUCTION: This exam was performed according to the departmental dose-optimization program which includes automated exposure control, adjustment of the mA and/or kV according to patient size and/or use of iterative reconstruction technique. COMPARISON:  CT head October 23, 2006. FINDINGS: Brain: New hypodensity within the right thalamus, concerning for acute infarct. No evidence of acute hemorrhage, hydrocephalus, extra-axial collection or mass lesion/mass effect. Mild patchy white matter hypodensities, nonspecific but compatible with mild chronic microvascular ischemic disease. Mild cerebral atrophy. Vascular: No hyperdense vessel identified. Skull: No acute fracture. Sinuses/Orbits: Clear sinuses.  No acute orbital findings. Other: No mastoid effusions IMPRESSION: New hypodensity within the right thalamus, concerning for acute infarct. Consider MRI to confirm and further assessed. Electronically Signed   By: Margaretha Sheffield M.D.   On: 04/01/2022 16:33  ? ?MR BRAIN WO CONTRAST ? ?Result Date: 04/02/2022 ?CLINICAL DATA:  87 year old female with neurologic deficit and suspicion of right thalamic lacunar infarct on plain head CT. EXAM: MRI HEAD WITHOUT CONTRAST TECHNIQUE: Multiplanar, multiecho pulse sequences of the brain and surrounding structures were obtained without intravenous contrast. COMPARISON:  CT head 04/01/2022. CTA head and neck 0243 hours today. FINDINGS: Brain: Oval 12 mm focus of restricted diffusion in the medial right thalamus with T2 and FLAIR hyperintensity corresponding  to the plain head CT finding. Mild T1 hypointensity. No hemorrhage or mass effect. No other restricted diffusion. No midline shift, mass effect, evidence of mass lesion, ventriculomegaly, extra-axial collection or acute intracranial hemorrhage. Cervicomedullary junction and pituitary are within normal limits. No cortical encephalomalacia or chronic cerebral blood products identified. Mild to moderate for age scattered and occasionally confluent bilateral cerebral white matter T2 and FLAIR hyperintensity. Outside of the right thalamus, there is a small chronic lacunar infarct of the left caudate. Brainstem is within normal limits. There are several tiny chronic lacunar infarcts of the right cerebellum on series 10, image 6. Vascular: Major intracranial vascular flow voids are preserved. Skull and upper cervical spine: Partially visible cervical ACDF. Visualized bone marrow signal is within normal limits. Sinuses/Orbits: Postoperative changes to both globes. Paranasal sinuses and mastoids are stable and well aerated. Other: Grossly normal visible internal auditory structures. Negative visible scalp and face. IMPRESSION: 1. Acute to subacute lacunar infarct of the medial right thalamus. No associated hemorrhage or mass effect. 2. No other acute intracranial abnormality. Other mild to moderate for age chronic small vessel disease. Electronically Signed   By: Genevie Ann M.D.   On: 04/02/2022 07:20   ? ?PHYSICAL EXAM ?Lungs- Respirations unlabored ?Extremities- No edema ?  ?Neurological Examination ?Mental Status: Alert, oriented x 5, thought content appropriate.  Speech fluent without evidence of aphasia.  Able to follow all commands without difficulty. ?Cranial Nerves: ?II: Temporal visual fields intact with no extinction to DSS. PERRL  ?III,IV, VI: No ptosis. EOMI.  ?V: Temp sensation equal bilaterally  ?VII: Smile symmetric ?VIII: Hearing intact to  voice ?IX,X: No hoarseness ?XI: Symmetric shoulder shrug ?XII: Midline  tongue extension ?Motor: ?BUE 5/5 proximally and distally, except for 4+/5 left grip strength ?BLE 5/5 proximally and distally  ?No pronator drift.  ?Sensory: Temp and light touch intact throughout, b

## 2022-04-02 NOTE — Discharge Summary (Incomplete)
? ?PATIENT DETAILS ?Name: Laura Riddle ?Age: 87 y.o. ?Sex: female ?Date of Birth: 04/29/1935 ?MRN: [id number]. ?Admitting Physician: Vianne Bulls, MD ?TDH:RCBUL, Jenny Reichmann, MD ? ?Admit Date: 04/01/2022 ?Discharge date: 04/02/2022 ? ?Recommendations for Outpatient Follow-up:  ?Follow up with PCP in 1-2 weeks ?Please obtain CMP/CBC in one week ?Please ensure follow-up with the stroke clinic. ? ?Admitted From:  ?Home ? ?Disposition: ?Home health ?  ?Discharge Condition: ?good ? ?CODE STATUS: ?  Code Status: DNR  ? ?Diet recommendation:  ?Diet Order   ? ?       ?  Diet Heart Room service appropriate? Yes; Fluid consistency: Thin  Diet effective now       ?  ? ?  ?  ? ?  ?  ? ?Brief Summary: ?Patient is a 87 y.o.  female with history of HTN, HLD, hypothyroidism, migraine-presented with slurred speech/dizziness-upon further evaluation she was found to have acute CVA. ?  ?  ?Significant events: ?5/5>> admit to [facility]-presented with slurred speech/dizziness. ?  ?Significant studies: ?5/5>> CT head: New hypodensity in the right thalamus-likely acute infarct ?5/5>> CT angio head/neck: No LVO or high-grade stenosis. ?5/5>> MRI brain: Acute to subacute lacunar infarct-medial right thalamus. ?5/5>> A1c: 7.5 ?5/5>> LDL: 73. ?5/5>> Echo: ? ?Consults: ?Neurology ?  ? ?Brief Hospital Course: ?Acute right thalamic infarct: Likely due to small vessel disease-work-up as above-no major focal deficits on exam. ?  ?HTN: Allow permissive hypertension-all antihypertensives on hold. ?  ?DM-2: CBGs stable with SSI.  Intolerant to metformin-apparently stopped taking Farxiga due to polyuria and weakness.  She is willing to try Tradjenta on discharge. ?  ?Hypothyroidism: Continue Synthroid ?  ?Migraine headache: Continue amitriptyline ?  ?BMI: ?Estimated body mass index is 26.62 kg/m? as calculated from the following: ?  Height as of this encounter: '5\' 6"'$  (1.676 m). ?  Weight as of this encounter: 74.8 kg.  ?  ? ?Discharge Diagnoses:  ?Principal  Problem: ?  Stroke [facility]) ?Active Problems: ?  Hypothyroidism ?  Essential hypertension ?  Nonintractable chronic migraine ? ? ?Discharge Instructions: ? ?Activity:  ?As tolerated  ? ? ?Allergies as of 04/02/2022   ? ?   Reactions  ? Penicillins Anaphylaxis, Rash  ? Morphine   ? Severe abdominal pain  ? Nitrofurantoin   ? Unknown reaction  ? ?  ?Med Rec must be completed prior to using this [place]*** ? ? ? ? ? ? ?Allergies  ?Allergen Reactions  ? Penicillins Anaphylaxis and Rash  ? Morphine   ?  Severe abdominal pain  ? Nitrofurantoin   ?  Unknown reaction  ? ? ? ?Other Procedures/Studies: ?CT ANGIO HEAD NECK W WO CM ? ?Result Date: 04/02/2022 ?CLINICAL DATA:  Stroke follow-up EXAM: CT ANGIOGRAPHY HEAD AND NECK TECHNIQUE: Multidetector CT imaging of the head and neck was performed using the standard protocol during bolus administration of intravenous contrast. Multiplanar CT image reconstructions and MIPs were obtained to evaluate the vascular anatomy. Carotid stenosis measurements (when applicable) are obtained utilizing NASCET criteria, using the distal internal carotid diameter as the denominator. RADIATION DOSE REDUCTION: This exam was performed according to the departmental dose-optimization program which includes automated exposure control, adjustment of the mA and/or kV according to patient size and/or use of iterative reconstruction technique. CONTRAST:  76m OMNIPAQUE IOHEXOL 350 MG/ML SOLN COMPARISON:  None Available. FINDINGS: CTA NECK FINDINGS SKELETON: There is no bony spinal canal stenosis. No lytic or blastic lesion. OTHER NECK: Normal pharynx, larynx and major salivary glands.  No cervical lymphadenopathy. Unremarkable thyroid gland. UPPER CHEST: No pneumothorax or pleural effusion. No nodules or masses. AORTIC ARCH: There is no calcific atherosclerosis of the aortic arch. There is no aneurysm, dissection or hemodynamically significant stenosis of the visualized portion of the aorta. Conventional 3  vessel aortic branching pattern. The visualized proximal subclavian arteries are widely patent. RIGHT CAROTID SYSTEM: No dissection, occlusion or aneurysm. Mild atherosclerotic calcification at the carotid bifurcation without hemodynamically significant stenosis. LEFT CAROTID SYSTEM: Normal without aneurysm, dissection or stenosis. VERTEBRAL ARTERIES: Left dominant configuration. Both origins are clearly patent. There is no dissection, occlusion or flow-limiting stenosis to the skull base (V1-V3 segments). CTA HEAD FINDINGS POSTERIOR CIRCULATION: --Vertebral arteries: Normal V4 segments. --Inferior cerebellar arteries: Normal. --Basilar artery: Normal. --Superior cerebellar arteries: Normal. --Posterior cerebral arteries (PCA): Normal. ANTERIOR CIRCULATION: --Intracranial internal carotid arteries: Normal. --Anterior cerebral arteries (ACA): Normal. Both A1 segments are present. Patent anterior communicating artery (a-comm). --Middle cerebral arteries (MCA): Normal. VENOUS SINUSES: As permitted by contrast timing, patent. ANATOMIC VARIANTS: None Review of the MIP images confirms the above findings. IMPRESSION: 1. No emergent large vessel occlusion or high-grade stenosis of the intracranial arteries. Electronically Signed   By: Ulyses Jarred M.D.   On: 04/02/2022 03:03  ? ?CT HEAD WO CONTRAST ? ?Result Date: 04/01/2022 ?CLINICAL DATA:  Neuro deficit, acute, stroke suspected EXAM: CT HEAD WITHOUT CONTRAST TECHNIQUE: Contiguous axial images were obtained from the base of the skull through the vertex without intravenous contrast. RADIATION DOSE REDUCTION: This exam was performed according to the departmental dose-optimization program which includes automated exposure control, adjustment of the mA and/or kV according to patient size and/or use of iterative reconstruction technique. COMPARISON:  CT head October 23, 2006. FINDINGS: Brain: New hypodensity within the right thalamus, concerning for acute infarct. No evidence  of acute hemorrhage, hydrocephalus, extra-axial collection or mass lesion/mass effect. Mild patchy white matter hypodensities, nonspecific but compatible with mild chronic microvascular ischemic disease. Mild cerebral atrophy. Vascular: No hyperdense vessel identified. Skull: No acute fracture. Sinuses/Orbits: Clear sinuses.  No acute orbital findings. Other: No mastoid effusions IMPRESSION: New hypodensity within the right thalamus, concerning for acute infarct. Consider MRI to confirm and further assessed. Electronically Signed   By: Margaretha Sheffield M.D.   On: 04/01/2022 16:33  ? ?MR BRAIN WO CONTRAST ? ?Result Date: 04/02/2022 ?CLINICAL DATA:  87 year old female with neurologic deficit and suspicion of right thalamic lacunar infarct on plain head CT. EXAM: MRI HEAD WITHOUT CONTRAST TECHNIQUE: Multiplanar, multiecho pulse sequences of the brain and surrounding structures were obtained without intravenous contrast. COMPARISON:  CT head 04/01/2022. CTA head and neck 0243 hours today. FINDINGS: Brain: Oval 12 mm focus of restricted diffusion in the medial right thalamus with T2 and FLAIR hyperintensity corresponding to the plain head CT finding. Mild T1 hypointensity. No hemorrhage or mass effect. No other restricted diffusion. No midline shift, mass effect, evidence of mass lesion, ventriculomegaly, extra-axial collection or acute intracranial hemorrhage. Cervicomedullary junction and pituitary are within normal limits. No cortical encephalomalacia or chronic cerebral blood products identified. Mild to moderate for age scattered and occasionally confluent bilateral cerebral white matter T2 and FLAIR hyperintensity. Outside of the right thalamus, there is a small chronic lacunar infarct of the left caudate. Brainstem is within normal limits. There are several tiny chronic lacunar infarcts of the right cerebellum on series 10, image 6. Vascular: Major intracranial vascular flow voids are preserved. Skull and upper  cervical spine: Partially visible cervical ACDF. Visualized bone marrow signal is within  normal limits. Sinuses/Orbits: Postoperative changes to both globes. Paranasal sinuses and mastoids are stable and well aerat

## 2022-04-02 NOTE — Progress Notes (Signed)
?      ?                 PROGRESS NOTE ? ?      ?PATIENT DETAILS ?Name: Laura Riddle ?Age: 87 y.o. ?Sex: female ?Date of Birth: [birth year] ?Admit Date: 04/01/2022 ?Admitting Physician Vianne Bulls, MD ?ZOX:WRUEA, Jenny Reichmann, MD ? ?Brief Summary: ?Patient is a 87 y.o.  female with history of HTN, HLD, hypothyroidism, migraine-presented with slurred speech/dizziness-upon further evaluation she was found to have acute CVA. ? ? ?Significant events: ?5/5>> admit to [facility]-presented with slurred speech/dizziness. ? ?Significant studies: ?5/5>> CT head: New hypodensity in the right thalamus-likely acute infarct ?5/5>> CT angio head/neck: No LVO or high-grade stenosis. ?5/5>> MRI brain: Acute to subacute lacunar infarct-medial right thalamus. ?5/5>> A1c: 7.5 ?5/5>> LDL: 73. ? ?Significant microbiology data: ? ? ?Procedures: ? ? ?Consults: ?Neurology ? ?Subjective: ?Lying comfortably in bed-denies any chest pain or shortness of breath.  Dizziness is much better.  Speech is clear. ? ?Objective: ?Vitals: ?Blood pressure (!) 152/51, pulse 65, temperature 97.9 ?F (36.6 ?C), temperature source Oral, resp. rate 16, height '5\' 6"'$  (1.676 m), weight 74.8 kg, SpO2 97 %.  ? ?Exam: ?Gen Exam:Alert awake-not in any distress ?HEENT:atraumatic, normocephalic ?Chest: B/L clear to auscultation anteriorly ?CVS:S1S2 regular ?Abdomen:soft non tender, non distended ?Extremities:no edema ?Neurology: Non focal ?Skin: no rash ? ?Pertinent Labs/Radiology: ? ?  Latest Ref Rng & Units 04/02/2022  ?  1:38 AM 04/01/2022  ?  4:11 PM  ?CBC  ?WBC 4.0 - 10.5 K/uL 9.2   10.1    ?Hemoglobin 12.0 - 15.0 g/dL 12.5   13.3    ?Hematocrit 36.0 - 46.0 % 38.6   40.8    ?Platelets 150 - 400 K/uL 195   267    ?  ?Lab Results  ?Component Value Date  ? NA 140 04/01/2022  ? K 4.1 04/01/2022  ? CL 105 04/01/2022  ? CO2 25 04/01/2022  ?  ? ? ?Assessment/Plan: ?Acute right thalamic infarct: Likely due to small vessel disease-work-up as above-no major focal deficits on exam-on  aspirin/Plavix/statin-awaiting echo-await further recommendations from stroke team. ? ?HTN: Allow permissive hypertension-all antihypertensives on hold. ? ?DM-2: CBGs stable with SSI.  Intolerant to metformin-apparently stopped taking Farxiga due to polyuria and weakness.  She is willing to try Tradjenta on discharge. ? ?Hypothyroidism: Continue Synthroid ? ?Migraine headache: Continue amitriptyline ? ?BMI: ?Estimated body mass index is 26.62 kg/m? as calculated from the following: ?  Height as of this encounter: '5\' 6"'$  (1.676 m). ?  Weight as of this encounter: 74.8 kg.  ? ?Code status: ?  Code Status: DNR  ? ?DVT Prophylaxis: ?enoxaparin (LOVENOX) injection 40 mg Start: 04/02/22 1000 ?  ?Family Communication: None at bedside ? ? ?Disposition Plan: ?Status is: Observation ?The patient remains OBS appropriate and will d/c before 2 midnights.  Awaiting completion of stroke work-up-stroke MD evaluation-before consideration of discharge. ?  ?Planned Discharge Destination:Home health ? ? ?Diet: ?Diet Order   ? ?       ?  Diet Heart Room service appropriate? Yes; Fluid consistency: Thin  Diet effective now       ?  ? ?  ?  ? ?  ?  ? ? ?Antimicrobial agents: ?Anti-infectives (From admission, onward)  ? ? None  ? ?  ? ? ? ?MEDICATIONS: ?Scheduled Meds: ? amitriptyline  50 mg Oral QHS  ? aspirin EC  81 mg Oral Daily  ? atorvastatin  40 mg  Oral Daily  ? clopidogrel  75 mg Oral Daily  ? enoxaparin (LOVENOX) injection  40 mg Subcutaneous Q24H  ? insulin aspart  0-6 Units Subcutaneous TID WC  ? levothyroxine  112 mcg Oral QAC breakfast  ? ?Continuous Infusions: ?PRN Meds:.acetaminophen **OR** acetaminophen (TYLENOL) oral liquid 160 mg/5 mL **OR** acetaminophen, diazepam, HYDROcodone-acetaminophen, senna-docusate ? ? ?I have personally reviewed following labs and imaging studies ? ?LABORATORY DATA: ?CBC: ?Recent Labs  ?Lab 04/01/22 ?1611 04/02/22 ?0138  ?WBC 10.1 9.2  ?NEUTROABS 6.5  --   ?HGB 13.3 12.5  ?HCT 40.8 38.6  ?MCV 89.9  90.4  ?PLT 267 195  ? ? ?Basic Metabolic Panel: ?Recent Labs  ?Lab 04/01/22 ?1611  ?NA 140  ?K 4.1  ?CL 105  ?CO2 25  ?GLUCOSE 133*  ?BUN 16  ?CREATININE 0.87  ?CALCIUM 9.6  ? ? ?GFR: ?Estimated Creatinine Clearance: 49.8 mL/min (by C-G formula based on SCr of 0.87 mg/dL). ? ?Liver Function Tests: ?No results for input(s): AST, ALT, ALKPHOS, BILITOT, PROT, ALBUMIN in the last 168 hours. ?No results for input(s): LIPASE, AMYLASE in the last 168 hours. ?No results for input(s): AMMONIA in the last 168 hours. ? ?Coagulation Profile: ?No results for input(s): INR, PROTIME in the last 168 hours. ? ?Cardiac Enzymes: ?No results for input(s): CKTOTAL, CKMB, CKMBINDEX, TROPONINI in the last 168 hours. ? ?BNP (last 3 results) ?No results for input(s): PROBNP in the last 8760 hours. ? ?Lipid Profile: ?Recent Labs  ?  04/02/22 ?0138  ?CHOL 145  ?HDL 43  ?[place] 73  ?TRIG 143  ?CHOLHDL 3.4  ? ? ?Thyroid Function Tests: ?No results for input(s): TSH, T4TOTAL, FREET4, T3FREE, THYROIDAB in the last 72 hours. ? ?Anemia Panel: ?No results for input(s): VITAMINB12, FOLATE, FERRITIN, TIBC, IRON, RETICCTPCT in the last 72 hours. ? ?Urine analysis: ?No results found for: COLORURINE, APPEARANCEUR, [place], [place], [place], [place], [place], KETONESUR, PROTEINUR, [place], NITRITE, LEUKOCYTESUR ? ?Sepsis Labs: ?Lactic Acid, Venous ?No results found for: LATICACIDVEN ? ?MICROBIOLOGY: ?No results found for this or any previous visit (from the past 240 hour(s)). ? ?RADIOLOGY STUDIES/RESULTS: ?CT ANGIO HEAD NECK W WO CM ? ?Result Date: 04/02/2022 ?CLINICAL DATA:  Stroke follow-up EXAM: CT ANGIOGRAPHY HEAD AND NECK TECHNIQUE: Multidetector CT imaging of the head and neck was performed using the standard protocol during bolus administration of intravenous contrast. Multiplanar CT image reconstructions and MIPs were obtained to evaluate the vascular anatomy. Carotid stenosis measurements (when applicable) are obtained utilizing NASCET  criteria, using the distal internal carotid diameter as the denominator. RADIATION DOSE REDUCTION: This exam was performed according to the departmental dose-optimization program which includes automated exposure control, adjustment of the mA and/or kV according to patient size and/or use of iterative reconstruction technique. CONTRAST:  4m OMNIPAQUE IOHEXOL 350 MG/ML SOLN COMPARISON:  None Available. FINDINGS: CTA NECK FINDINGS SKELETON: There is no bony spinal canal stenosis. No lytic or blastic lesion. OTHER NECK: Normal pharynx, larynx and major salivary glands. No cervical lymphadenopathy. Unremarkable thyroid gland. UPPER CHEST: No pneumothorax or pleural effusion. No nodules or masses. AORTIC ARCH: There is no calcific atherosclerosis of the aortic arch. There is no aneurysm, dissection or hemodynamically significant stenosis of the visualized portion of the aorta. Conventional 3 vessel aortic branching pattern. The visualized proximal subclavian arteries are widely patent. RIGHT CAROTID SYSTEM: No dissection, occlusion or aneurysm. Mild atherosclerotic calcification at the carotid bifurcation without hemodynamically significant stenosis. LEFT CAROTID SYSTEM: Normal without aneurysm, dissection or stenosis. VERTEBRAL ARTERIES: Left dominant configuration. Both origins  are clearly patent. There is no dissection, occlusion or flow-limiting stenosis to the skull base (V1-V3 segments). CTA HEAD FINDINGS POSTERIOR CIRCULATION: --Vertebral arteries: Normal V4 segments. --Inferior cerebellar arteries: Normal. --Basilar artery: Normal. --Superior cerebellar arteries: Normal. --Posterior cerebral arteries (PCA): Normal. ANTERIOR CIRCULATION: --Intracranial internal carotid arteries: Normal. --Anterior cerebral arteries (ACA): Normal. Both A1 segments are present. Patent anterior communicating artery (a-comm). --Middle cerebral arteries (MCA): Normal. VENOUS SINUSES: As permitted by contrast timing, patent. ANATOMIC  VARIANTS: None Review of the MIP images confirms the above findings. IMPRESSION: 1. No emergent large vessel occlusion or high-grade stenosis of the intracranial arteries. Electronically Signed   By: Lennette Bihari  He

## 2022-04-02 NOTE — Evaluation (Signed)
Physical Therapy Evaluation ?Patient Details ?Name: Laura Riddle ?MRN: [id number] ?DOB: 07-Jun-1935 ?Today's Date: 04/02/2022 ? ?History of Present Illness ? 87 y/o female presented to ED on 04/01/22 for dizziness and slurred speech. CT showed hypodensity in R thalamus concerning for acute infarct. MRI confirmed acute to subacute lacunar infarct of medial R thalamus. PMH: HTN, chronic migraine, hypothyroidism.  ?Clinical Impression ? Patient admitted with the above findings. Patient presents with dizziness, weakness, and balance deficits. Patient with + orthostatics (see below), however reports increased dizziness with head turns and after readjusting bed pads. Difficult to assess nystagmus as patient requested to keep lights off due to headache. Patient requires min guard for mobility due to dizziness and unsteadiness with use of walking stick for support. Patient lives in independent living facility with husband who has Alzheimer's. Patient will benefit from skilled PT services during acute stay to address listed deficits. Recommend HHPT at discharge to maximize functional independence and return to PLOF.  ? ?Orthostatic BPs ? ?Sitting 144/64  ?Standing 115/62  ?Standing after 3 min 136/72  ?  ?   ?   ? ?Recommendations for follow up therapy are one component of a multi-disciplinary discharge planning process, led by the attending physician.  Recommendations may be updated based on patient status, additional functional criteria and insurance authorization. ? ?Follow Up Recommendations Home health PT ? ?  ?Assistance Recommended at Discharge Intermittent Supervision/Assistance  ?Patient can return home with the following ?   ? ?  ?Equipment Recommendations None recommended by PT  ?Recommendations for Other Services ?    ?  ?Functional Status Assessment Patient has had a recent decline in their functional status and demonstrates the ability to make significant improvements in function in a reasonable and predictable  amount of time.  ? ?  ?Precautions / Restrictions Precautions ?Precautions: Fall ?Precaution Comments: dizzy, orthostatic, possible vertigo? ?Restrictions ?Weight Bearing Restrictions: No  ? ?  ? ?Mobility ? Bed Mobility ?Overal bed mobility: Modified Independent ?  ?  ?  ?  ?  ?  ?General bed mobility comments: HOB elevated ?  ? ?Transfers ?Overall transfer level: Needs assistance ?Equipment used: Straight cane (walking stick) ?Transfers: Sit to/from Stand ?Sit to Stand: Supervision ?  ?  ?  ?  ?  ?General transfer comment: supervision for safety ?  ? ?Ambulation/Gait ?Ambulation/Gait assistance: Min guard ?Gait Distance (Feet): 20 Feet ?Assistive device:  (walking stick) ?Gait Pattern/deviations: Step-through pattern, Decreased stride length ?Gait velocity: decreased ?  ?  ?General Gait Details: very hesitant guarded gait pattern due to dizziness with mobility. Patient reports increased dizziness wiht head turns and after bending over to adjust bed pad ? ?Stairs ?  ?  ?  ?  ?  ? ?Wheelchair Mobility ?  ? ?Modified Rankin (Stroke Patients Only) ?  ? ?  ? ?Balance Overall balance assessment: Mild deficits observed, not formally tested ?  ?  ?  ?  ?  ?  ?  ?  ?  ?  ?  ?  ?  ?  ?  ?  ?  ?  ?   ? ? ? ?Pertinent Vitals/Pain Pain Assessment ?Pain Assessment: Faces ?Faces Pain Scale: Hurts little more ?Pain Location: head ?Pain Descriptors / Indicators: Headache ?Pain Intervention(s): Monitored during session, Repositioned  ? ? ?Home Living Family/patient expects to be discharged to:: Private residence ?Living Arrangements: Spouse/significant other ?Available Help at Discharge: Family ?Type of Home: Independent living facility ?Home Access: Level entry ?  ?  ?  ?  Home Layout: One level ?Home Equipment: Grab bars - tub/shower;Grab bars - toilet;Shower seat;Cane - single point (walking stick) ?   ?  ?Prior Function Prior Level of Function : Independent/Modified Independent ?  ?  ?  ?  ?  ?  ?  ?  ?  ? ? ?Hand Dominance  ?  Dominant Hand: Right ? ?  ?Extremity/Trunk Assessment  ? Upper Extremity Assessment ?Upper Extremity Assessment: Defer to OT evaluation ?  ? ?Lower Extremity Assessment ?Lower Extremity Assessment: Generalized weakness ?  ? ?Cervical / Trunk Assessment ?Cervical / Trunk Assessment: Normal  ?Communication  ? Communication: No difficulties  ?Cognition Arousal/Alertness: Awake/alert ?Behavior During Therapy: [facility] for tasks assessed/performed ?Overall Cognitive Status: Within Functional Limits for tasks assessed ?  ?  ?  ?  ?  ?  ?  ?  ?  ?  ?  ?  ?  ?  ?  ?  ?  ?  ?  ? ?  ?General Comments   ? ?  ?Exercises    ? ?Assessment/Plan  ?  ?PT Assessment Patient needs continued PT services  ?PT Problem List Decreased strength;Decreased activity tolerance;Decreased balance;Decreased mobility ? ?   ?  ?PT Treatment Interventions DME instruction;Gait training;Functional mobility training;Therapeutic activities;Therapeutic exercise;Balance training;Patient/family education   ? ?PT Goals (Current goals can be found in the Care Plan section)  ?Acute Rehab PT Goals ?Patient Stated Goal: to reduce dizziness ?PT Goal Formulation: With patient ?Time For Goal Achievement: 04/16/22 ?Potential to Achieve Goals: Good ? ?  ?Frequency Min 4X/week ?  ? ? ?Co-evaluation   ?  ?  ?  ?  ? ? ?  ?AM-PAC PT "6 Clicks" Mobility  ?Outcome Measure Help needed turning from your back to your side while in a flat bed without using bedrails?: None ?Help needed moving from lying on your back to sitting on the side of a flat bed without using bedrails?: None ?Help needed moving to and from a bed to a chair (including a wheelchair)?: A Little ?Help needed standing up from a chair using your arms (e.g., wheelchair or bedside chair)?: A Little ?Help needed to walk in hospital room?: A Little ?Help needed climbing 3-5 steps with a railing? : A Little ?6 Click Score: 20 ? ?  ?End of Session Equipment Utilized During Treatment: Gait belt ?Activity Tolerance:  Patient tolerated treatment well ?Patient left: in bed;with call bell/phone within reach ?Nurse Communication: Mobility status ?PT Visit Diagnosis: Unsteadiness on feet (R26.81);Muscle weakness (generalized) (M62.81);Dizziness and giddiness (R42) ?  ? ?Time: 2263-3354 ?PT Time Calculation (min) (ACUTE ONLY): 24 min ? ? ?Charges:   PT Evaluation ?$PT Eval Low Complexity: 1 Low ?PT Treatments ?$Therapeutic Activity: 8-22 mins ?  ?   ? ? ?Amad Mau A. Gilford Rile, PT, DPT ?Acute Rehabilitation Services ?Pager [phone] ?Office [phone] ? ? ?Laura Riddle ?04/02/2022, 9:05 AM ? ?

## 2022-04-02 NOTE — Progress Notes (Signed)
Pt being transported to CT

## 2022-04-02 NOTE — Progress Notes (Signed)
Discussed with neurology-work-up complete-however patient still unsteady and dizzy-recommendations are to continue inpatient monitoring overnight.  Recommendations are to continue aspirin/Plavix for 3 weeks followed by aspirin alone.  We will reassess for discharge tomorrow. ?

## 2022-04-02 NOTE — Progress Notes (Signed)
Occupational Therapy Evaluation ? ?Patient lives with spouse who has Alzheimer's and patient is independent at baseline with self care and ambulation without AD. Patient reports some intermittent double vision starting ~1 week prior to hospitalization having to close one eye to read things, but did not present with this on evaluation. Patient also endorsing dizziness with difficulty describing if it gets worse with certain positional changes. Did not note nystagmus during eval. Patient as able to read signs in hospital room without difficulty. Encouraged patient to report visual symptoms to neurologist as they had not checked in on her at time of OT eval. Overall patient supervision level for ambulation in her room without AD, with 1 lateral listing to left with patient able to self correct. Will continue to follow acutely to maximize patient safety and independence with self care in order to facilitate D/C to venue listed below. ? ? 04/02/22 1143  ?OT Visit Information  ?Last OT Received On 04/02/22  ?Assistance Needed +1  ?History of Present Illness 87 y/o female presented to ED on 04/01/22 for dizziness and slurred speech. CT showed hypodensity in R thalamus concerning for acute infarct. MRI confirmed acute to subacute lacunar infarct of medial R thalamus. PMH: HTN, chronic migraine, hypothyroidism.  ?Precautions  ?Precautions Fall  ?Precaution Comments dizzy, orthostatic, possible vertigo?  ?Restrictions  ?Weight Bearing Restrictions No  ?Home Living  ?Family/patient expects to be discharged to: Private residence  ?Living Arrangements Spouse/significant other  ?Available Help at Discharge Family  ?Type of Home Independent living facility  ?Home Access Level entry  ?Home Layout One level  ?Bathroom Shower/Tub Walk-in shower;Tub/shower unit  ?Bathroom Toilet Handicapped height  ?Home Equipment Grab bars - tub/shower;Grab bars - toilet;Shower seat;Cane - single point ?(walking stick)  ?Additional Comments Patient  reports spouse has Alzheimer's  ? Lives With Spouse  ?Prior Function  ?Prior Level of Function  Independent/Modified Independent  ?Communication  ?Communication No difficulties  ?Pain Assessment  ?Pain Assessment Faces  ?Faces Pain Scale 4  ?Pain Location head  ?Pain Descriptors / Indicators Headache  ?Pain Intervention(s) RN gave pain meds during session  ?Cognition  ?Arousal/Alertness Awake/alert  ?Behavior During Therapy [facility] for tasks assessed/performed  ?Overall Cognitive Status Within Functional Limits for tasks assessed  ?Upper Extremity Assessment  ?Upper Extremity Assessment Overall WFL for tasks assessed  ?Lower Extremity Assessment  ?Lower Extremity Assessment Defer to PT evaluation  ?Cervical / Trunk Assessment  ?Cervical / Trunk Assessment Normal  ?Vision- History  ?Baseline Vision/History 1 Wears glasses  ?Vision- Assessment  ?Additional Comments Patient reports intermittent double vision starting ~1 week ago however did not have on eval. Patient was able to read signs in room without difficulty. Patient also reporting dizziness and eye pain "behind my eyes" with history of migraines. Did not note any nystagmus with positional changes  ?ADL  ?Overall ADL's  Needs assistance/impaired  ?Eating/Feeding Independent  ?Grooming Set up;Sitting  ?Upper Body Bathing Set up;Sitting  ?Lower Body Bathing Supervison/ safety;Sit to/from stand  ?Upper Body Dressing  Set up;Sitting  ?Lower Body Dressing Set up;Supervision/safety;Sitting/lateral leans;Sit to/from stand  ?Lower Body Dressing Details (indicate cue type and reason) Patient able to doff/don socks while seated at edge of bed  ?Automotive engineer;Ambulation  ?Toilet Transfer Details (indicate cue type and reason) Patient walking tentatively in the room with minor listing to L and patient able to self correct  ?Toileting- Technical sales engineer;Sit to/from stand  ?Functional mobility during ADLs Supervision/safety   ?General ADL Comments Patient is  close to her baseline however with mild balance deficits and one instance of listing to the L  ?Bed Mobility  ?Overal bed mobility Modified Independent  ?General bed mobility comments HOB elevated  ?Balance  ?Overall balance assessment Mild deficits observed, not formally tested  ?OT - End of Session  ?Activity Tolerance Patient tolerated treatment well  ?Patient left in bed;with call bell/phone within reach;with bed alarm set  ?Nurse Communication Mobility status  ?OT Assessment  ?OT Recommendation/Assessment Patient needs continued OT Services  ?OT Visit Diagnosis Unsteadiness on feet (R26.81);Other symptoms and signs involving the nervous system (R29.898)  ?OT Problem List Decreased activity tolerance;Impaired balance (sitting and/or standing)  ?OT Plan  ?OT Frequency (ACUTE ONLY) Min 2X/week  ?OT Treatment/Interventions (ACUTE ONLY) Self-care/ADL training;Balance training;Patient/family education;Therapeutic activities;Visual/perceptual remediation/compensation  ?AM-PAC OT "6 Clicks" Daily Activity Outcome Measure (Version 2)  ?Help from another person eating meals? 4  ?Help from another person taking care of personal grooming? 3  ?Help from another person toileting, which includes using toliet, bedpan, or urinal? 3  ?Help from another person bathing (including washing, rinsing, drying)? 3  ?Help from another person to put on and taking off regular upper body clothing? 3  ?Help from another person to put on and taking off regular lower body clothing? 3  ?6 Click Score 19  ?Progressive Mobility  ?What is the highest level of mobility based on the progressive mobility assessment? Level 4 (Walks with assist in room) - Balance while marching in place and cannot step forward and back - Complete  ?Activity Ambulated with assistance in room  ?OT Recommendation  ?Follow Up Recommendations Home health OT  ?Assistance recommended at discharge Intermittent Supervision/Assistance  ?Patient  can return home with the following A little help with walking and/or transfers;A little help with bathing/dressing/bathroom;Assistance with cooking/housework;Assist for transportation;Help with stairs or ramp for entrance  ?Functional Status Assessent Patient has had a recent decline in their functional status and demonstrates the ability to make significant improvements in function in a reasonable and predictable amount of time.  ?OT Equipment None recommended by OT  ?Individuals Consulted  ?Consulted and Agree with Results and Recommendations Patient  ?Acute Rehab OT Goals  ?Patient Stated Goal Be out of here soon  ?OT Goal Formulation With patient  ?Time For Goal Achievement 04/16/22  ?Potential to Achieve Goals Good  ?OT Time Calculation  ?OT Start Time (ACUTE ONLY) 0753  ?OT Stop Time (ACUTE ONLY) 0810  ?OT Time Calculation (min) 17 min  ?OT General Charges  ?$OT Visit 1 Visit  ?OT Evaluation  ?$OT Eval Low Complexity 1 Low  ?Written Expression  ?Dominant Hand Right  ? ?Delbert Phenix OT ?OT pager: [phone] ? ?

## 2022-04-02 NOTE — Plan of Care (Signed)
?  Problem: Education: ?Goal: Knowledge of disease or condition will improve ?Outcome: Progressing ?Goal: Knowledge of secondary prevention will improve (SELECT ALL) ?Outcome: Progressing ?  ?Problem: Self-Care: ?Goal: Ability to participate in self-care as condition permits will improve ?Outcome: Progressing ?  ?

## 2022-04-02 NOTE — Evaluation (Signed)
Speech Language Pathology Evaluation ?Patient Details ?Name: Laura Riddle ?MRN: [id number] ?DOB: 1935/02/09 ?Today's Date: 04/02/2022 ?Time: 1448-1856 ?SLP Time Calculation (min) (ACUTE ONLY): 25 min ? ?Problem List:  ?Patient Active Problem List  ? Diagnosis Date Noted  ? Nonintractable chronic migraine   ? Stroke ([place]) 04/01/2022  ? Pain in left wrist 05/13/2021  ? Pain in right shoulder 02/24/2021  ? Essential hypertension 11/10/2009  ? COLONIC POLYPS, HYPERPLASTIC 11/09/2009  ? Hypothyroidism 11/09/2009  ? HYPERLIPIDEMIA 11/09/2009  ? GERD 11/09/2009  ? HIATAL HERNIA 11/09/2009  ? IBS 11/09/2009  ? ?Past Medical History:  ?Past Medical History:  ?Diagnosis Date  ? Colon polyps   ? GERD (gastroesophageal reflux disease)   ? Hiatal hernia   ? Hyperlipidemia   ? Hypertension   ? Hypothyroidism   ? IBS (irritable bowel syndrome)   ? ?Past Surgical History:  ?Past Surgical History:  ?Procedure Laterality Date  ? ABDOMINAL HYSTERECTOMY    ? APPENDECTOMY    ? BACK SURGERY    ? BACK SURGERY    ? L5-S1  ? CATARACT EXTRACTION    ? CERVICAL FUSION    ? CHOLECYSTECTOMY    ? WRIST FRACTURE SURGERY    ? 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 CT showed hypodensity in R thalamus concerning for acute infarct. MRI confirmed acute to subacute lacunar infarct of medial R thalamus. PMH: HTN, chronic migraine, hypothyroidism.  ? ?Assessment / Plan / Recommendation ?Clinical Impression ? Pt presents with clear, fluent speech without dysarthria.  Expressive and receptive language are WNL. Awareness, judgment, higher level attention, and executive functioning are WNL.  Pt will not need f/u SLP upon D/C. Our service will sign off. ?   ?SLP Assessment ? SLP Recommendation/Assessment: Patient does not need any further [facility] Services ?SLP Visit Diagnosis: Cognitive communication deficit (R41.841)  ?  ?Recommendations for  follow up therapy are one component of a multi-disciplinary discharge planning process, led by the attending physician.  Recommendations may be updated based on patient status, additional functional criteria and insurance authorization. ?   ?Follow Up Recommendations ? No SLP follow up  ?  ?    ?    ?    ?  ?  ?   ?SLP Evaluation ?Cognition ? Overall Cognitive Status: Within Functional Limits for tasks assessed ?Arousal/Alertness: Awake/alert ?Orientation Level: Oriented X4 ?Attention: Selective ?Selective Attention: Appears intact ?Memory: Appears intact ?Awareness: Appears intact ?Problem Solving: Appears intact ?Executive Function: Reasoning ?Reasoning: Appears intact ?Safety/Judgment: Appears intact  ?  ?   ?Comprehension ? Auditory Comprehension ?Overall Auditory Comprehension: Appears within functional limits for tasks assessed ?Reading Comprehension ?Reading Status: Not tested  ?  ?Expression Expression ?Primary Mode of Expression: Verbal ?Verbal Expression ?Overall Verbal Expression: Appears within functional limits for tasks assessed ?Written Expression ?Dominant Hand: Right   ?Oral / Motor ? Oral Motor/Sensory Function ?Overall Oral Motor/Sensory Function: Within functional limits ?Motor Speech ?Overall Motor Speech: Appears within functional limits for tasks assessed   ?        ? ?Juan Quam Laurice ?04/02/2022, 11:44 AM ?Estill Bamberg L. Jelan Batterton, MA CCC/SLP ?Acute Rehabilitation Services ?Office number [phone] ?Pager [phone] ? ?

## 2022-04-02 NOTE — Care Management Obs Status (Signed)
MEDICARE OBSERVATION STATUS NOTIFICATION ? ? ?Patient Details  ?Name: Laura Riddle ?MRN: [id number] ?Date of Birth: Aug 06, 1935 ? ? ?Medicare Observation Status Notification Given:  Yes ? ? ? ?Carles Collet, RN ?04/02/2022, 2:29 PM ?

## 2022-04-02 NOTE — Plan of Care (Signed)
Pt. Verbalizes s&s of stroke/TIA and that she should call 911. ? ?Problem: Education: ?Goal: Knowledge of disease or condition will improve ?04/02/2022 1654 by Larry Sierras, RN ?Outcome: Progressing ?04/02/2022 1654 by Larry Sierras, RN ?Outcome: Progressing ?Goal: Knowledge of secondary prevention will improve ([facility] ALL) ?04/02/2022 1654 by Larry Sierras, RN ?Outcome: Progressing ?04/02/2022 1654 by Larry Sierras, RN ?Outcome: Progressing ?  ?

## 2022-04-02 NOTE — Progress Notes (Signed)
?  Echocardiogram ?2D Echocardiogram has been performed. ? ?Elmer Ramp ?04/02/2022, 12:16 PM ?

## 2022-04-02 NOTE — Progress Notes (Signed)
Pt is being transported to MRI.

## 2022-04-02 NOTE — TOC Initial Note (Signed)
Transition of Care (TOC) - Initial/Assessment Note  ? ? ?Patient Details  ?Name: Laura Riddle ?MRN: [id number] ?Date of Birth: 02-22-1935 ? ?Transition of Care (TOC) CM/SW Contact:    ?Carles Collet, RN ?Phone Number: ?04/02/2022, 3:27 PM ? ?Clinical Narrative:        Spoke w patient at bedside. She states that she is from [place], she lives there with her spouse. Spouse will provide transportation home tomorrow. ?We reviewed recs for [facility] and she states that she spoken with Barb Merino her nurse navigator at [facility] who will arrange for their therapy agency to provide needed [facility] services.  ?Patient states she has walking stick, declines other DME needs.          ? ? ?Expected Discharge Plan: Home/Self Care ?Barriers to Discharge: Continued Medical Work up ? ? ?Patient Goals and CMS Choice ?Patient states their goals for this hospitalization and ongoing recovery are:: to return home to [place] ?  ?  ? ?Expected Discharge Plan and Services ?Expected Discharge Plan: Home/Self Care ?  ?Discharge Planning Services: CM Consult ?Post Acute Care Choice: Home Health ?Living arrangements for the past 2 months: Apartment ?                ?  ?  ?  ?  ?  ?  ?  ?  ?  ?  ? ?Prior Living Arrangements/Services ?Living arrangements for the past 2 months: Apartment ?Lives with:: Spouse ?  ?       ?  ?  ?Current home services: DME ?  ? ?Activities of Daily Living ?  ?  ? ?Permission Sought/Granted ?  ?  ?   ?   ?   ?   ? ?Emotional Assessment ?  ?  ?  ?  ?  ?  ? ?Admission diagnosis:  Stroke [facility]) [I63.9] ?Cerebrovascular accident (CVA), unspecified mechanism ([place]) [I63.9] ?Patient Active Problem List  ? Diagnosis Date Noted  ? Nonintractable chronic migraine   ? Stroke ([place]) 04/01/2022  ? Pain in left wrist 05/13/2021  ? Pain in right shoulder 02/24/2021  ? Essential hypertension 11/10/2009  ? COLONIC POLYPS, HYPERPLASTIC 11/09/2009  ? Hypothyroidism 11/09/2009  ? HYPERLIPIDEMIA 11/09/2009  ? GERD 11/09/2009  ?  HIATAL HERNIA 11/09/2009  ? IBS 11/09/2009  ? ?PCP:  Shon Baton, MD ?Pharmacy:   ?[facility] [place] ?[place] [address] ?Phone: [phone] Fax: [phone] ? ?[place] [address] ?Phone: [phone] Fax: [phone] ? ?[facility] [address] ?[address] ?[place] [address] ?Phone: [phone] Fax: [phone] ? ? ? ? ?Social Determinants of Health (SDOH) Interventions ?  ? ?Readmission Risk Interventions ?   ? View : No data to display.  ?  ?  ?  ? ? ? ?

## 2022-04-03 DIAGNOSIS — G43909 Migraine, unspecified, not intractable, without status migrainosus: Secondary | ICD-10-CM | POA: Diagnosis not present

## 2022-04-03 DIAGNOSIS — I639 Cerebral infarction, unspecified: Secondary | ICD-10-CM | POA: Diagnosis not present

## 2022-04-03 DIAGNOSIS — E039 Hypothyroidism, unspecified: Secondary | ICD-10-CM | POA: Diagnosis not present

## 2022-04-03 DIAGNOSIS — I10 Essential (primary) hypertension: Secondary | ICD-10-CM | POA: Diagnosis not present

## 2022-04-03 LAB — GLUCOSE, CAPILLARY: Glucose-Capillary: 161 mg/dL — ABNORMAL HIGH (ref 70–99)

## 2022-04-03 MED ORDER — OLMESARTAN MEDOXOMIL 40 MG PO TABS: 40.0000 mg | ORAL_TABLET | Freq: Every day | ORAL | Status: AC

## 2022-04-03 MED ORDER — METFORMIN HCL 500 MG PO TABS: 500.0000 mg | ORAL_TABLET | Freq: Two times a day (BID) | ORAL | 0 refills | Status: DC

## 2022-04-03 MED ORDER — ATORVASTATIN CALCIUM 40 MG PO TABS: 40.0000 mg | ORAL_TABLET | Freq: Every day | ORAL | 0 refills | Status: AC

## 2022-04-03 MED ORDER — CLOPIDOGREL BISULFATE 75 MG PO TABS: 75.0000 mg | ORAL_TABLET | Freq: Every day | ORAL | 0 refills | Status: DC

## 2022-04-03 MED ORDER — ASPIRIN 81 MG PO TBEC: 81.0000 mg | DELAYED_RELEASE_TABLET | Freq: Every day | ORAL | 0 refills | Status: AC

## 2022-04-03 MED ORDER — FAMOTIDINE 20 MG PO TABS: 20.0000 mg | ORAL_TABLET | Freq: Every day | ORAL | 0 refills | Status: DC

## 2022-04-03 NOTE — Discharge Instructions (Addendum)
Follow with Primary MD Shon Baton, MD in 7 days, also follow with the recommended neurologist in 2 weeks ? ?Get CBC, CMP, 2 view Chest X ray -  checked next visit within 1 week by Primary MD   ? ?Activity: As tolerated with Full fall precautions use walker/cane & assistance as needed ? ?Disposition ALF ? ?Diet: Heart Healthy low carbohydrate ? ?Special Instructions: If you have smoked or chewed Tobacco  in the last 2 yrs please stop smoking, stop any regular Alcohol  and or any Recreational drug use. ? ?On your next visit with your primary care physician please Get Medicines reviewed and adjusted. ? ?Please request your Prim.MD to go over all Hospital Tests and Procedure/Radiological results at the follow up, please get all Hospital records sent to your Prim MD by signing hospital release before you go home. ? ?If you experience worsening of your admission symptoms, develop shortness of breath, life threatening emergency, suicidal or homicidal thoughts you must seek medical attention immediately by calling 911 or calling your MD immediately  if symptoms less severe. ? ?You Must read complete instructions/literature along with all the possible adverse reactions/side effects for all the Medicines you take and that have been prescribed to you. Take any new Medicines after you have completely understood and accpet all the possible adverse reactions/side effects.  ? ?  ?

## 2022-04-03 NOTE — Discharge Summary (Signed)
?                                                                                ? ?MARKISHA MEDING LDJ:[id number] DOB: Feb 14, 1935 DOA: 04/01/2022 ? ?PCP: Shon Baton, MD ? ?Admit date: 04/01/2022  Discharge date: 04/03/2022 ? ?Admitted From: ALF   Disposition:  ALF ? ? ?Recommendations for Outpatient Follow-up:  ? ?Follow up with PCP in 1-2 weeks ? ?PCP Please obtain BMP/CBC, 2 view CXR in 1week,  (see Discharge instructions)  ? ?PCP Please follow up on the following pending results: Monitor LDL, A1c, electrolytes closely.  Needs outpatient neurology follow-up within 2 to 3 weeks. ? ? ?Home Health: If qualifies she will get PT, OT ?Equipment/Devices: As below ?Consultations: Neuro ?Discharge Condition: Stable    ?CODE STATUS: Full    ?Diet Recommendation: Heart Healthy Low Carb ? ?  ? ?Chief Complaint  ?Patient presents with  ? Dizziness  ?  ? ?Brief history of present illness from the day of admission and additional interim summary   ? ?87 y.o.  female with history of HTN, HLD, hypothyroidism, migraine-presented with slurred speech/dizziness-upon further evaluation she was found to have acute CVA. ?  ?  ?Significant events: ?5/5>> admit to [facility]-presented with slurred speech/dizziness. ?  ?Significant studies: ?5/5>> CT head: New hypodensity in the right thalamus-likely acute infarct ?5/5>> CT angio head/neck: No LVO or high-grade stenosis. ?5/5>> MRI brain: Acute to subacute lacunar infarct-medial right thalamus. ?5/5>> A1c: 7.5 ?5/5>> LDL: 73. ? ?                                                               Hospital Course  ? ?Acute to subacute lacunar infarct of the medial right thalamus - Likely due to small vessel disease-underwent full stroke work-up, symptoms much better, case discussed with stroke team on 04/03/2022, DAPT for 21 weeks thereafter aspirin alone, statin indefinitely continued at higher than home dose for better  LDL control, needs close outpatient follow-up with PCP and neurology.  We will get home PT OT and a rolling walker if she qualifies.  Going back to ALF. ?  ?HTN: Allow permissive hypertension-all antihypertensives on hold.  Resume blood pressure medicines from tomorrow. ?  ?DM-2: CBGs stable with SSI.  Challenge with low-dose metformin.  She had taken [place] before but stopped taking it due to its side effects, has PCP to monitor A1c and outpatient diabetic management closely.  She has not been taking any diabetic medications for a while ?  ?Hypothyroidism: Continue Synthroid ?  ?Migraine headache: Continue amitriptyline ? ? ?Discharge diagnosis   ? ? ?Principal Problem: ?  Stroke [facility]) ?Active Problems: ?  Hypothyroidism ?  Essential hypertension ?  Nonintractable chronic migraine ? ? ? ?Discharge instructions   ? ?Discharge Instructions   ? ? Ambulatory referral to Neurology   Complete by: As directed ?  ? An appointment is requested in approximately: 4 weeks ?Stroke and migraines worsening  ?  Discharge instructions   Complete by: As directed ?  ? Follow with Primary MD Shon Baton, MD in 7 days, also follow with the recommended neurologist in 2 weeks ? ?Get CBC, CMP, 2 view Chest X ray -  checked next visit within 1 week by Primary MD   ? ?Activity: As tolerated with Full fall precautions use walker/cane & assistance as needed ? ?Disposition ALF ? ?Diet: Heart Healthy low carbohydrate ? ?Special Instructions: If you have smoked or chewed Tobacco  in the last 2 yrs please stop smoking, stop any regular Alcohol  and or any Recreational drug use. ? ?On your next visit with your primary care physician please Get Medicines reviewed and adjusted. ? ?Please request your Prim.MD to go over all Hospital Tests and Procedure/Radiological results at the follow up, please get all Hospital records sent to your Prim MD by signing hospital release before you go home. ? ?If you experience worsening of your admission symptoms,  develop shortness of breath, life threatening emergency, suicidal or homicidal thoughts you must seek medical attention immediately by calling 911 or calling your MD immediately  if symptoms less severe. ? ?You Must read complete instructions/literature along with all the possible adverse reactions/side effects for all the Medicines you take and that have been prescribed to you. Take any new Medicines after you have completely understood and accpet all the possible adverse reactions/side effects.  ? Increase activity slowly   Complete by: As directed ?  ? ?  ? ? ?Discharge Medications  ? ?Allergies as of 04/03/2022   ? ?   Reactions  ? Penicillins Anaphylaxis, Rash  ? Morphine   ? Severe abdominal pain  ? Nitrofurantoin   ? Unknown reaction  ? ?  ? ?  ?Medication List  ?  ? ?TAKE these medications   ? ?amitriptyline 50 MG tablet ?Commonly known as: ELAVIL ?Take 50 mg by mouth at bedtime. ?  ?aspirin 81 MG EC tablet ?Take 1 tablet (81 mg total) by mouth daily. Swallow whole. ?  ?atorvastatin 40 MG tablet ?Commonly known as: LIPITOR ?Take 1 tablet (40 mg total) by mouth daily. ?What changed:  ?medication strength ?how much to take ?  ?clopidogrel 75 MG tablet ?Commonly known as: PLAVIX ?Take 1 tablet (75 mg total) by mouth daily. ?  ?diazepam 5 MG tablet ?Commonly known as: VALIUM ?Take 2.5 mg by mouth every 12 (twelve) hours as needed (migraine). ?  ?diclofenac sodium 1 % Gel ?Commonly known as: Voltaren ?Apply 2-4 g topically 4 (four) times daily. ?  ?famotidine 20 MG tablet ?Commonly known as: PEPCID ?Take 1 tablet (20 mg total) by mouth daily. ?  ?Farxiga 5 MG Tabs tablet ?Generic drug: dapagliflozin propanediol ?Take 5 mg by mouth daily. ?  ?HYDROcodone-acetaminophen 10-325 MG tablet ?Commonly known as: NORCO ?Take 1 tablet by mouth 2 (two) times daily as needed (migraine). ?  ?levothyroxine 112 MCG tablet ?Commonly known as: SYNTHROID ?Take 112 mcg by mouth daily before breakfast. Brand name Synthroid ?   ?loratadine 10 MG tablet ?Commonly known as: CLARITIN ?Take 10 mg by mouth daily. ?  ?metFORMIN 500 MG tablet ?Commonly known as: Glucophage ?Take 1 tablet (500 mg total) by mouth 2 (two) times daily with a meal. ?Start taking on: Apr 05, 2022 ?  ?multivitamin tablet ?Take 1 tablet by mouth daily. ?  ?olmesartan 40 MG tablet ?Commonly known as: BENICAR ?Take 1 tablet (40 mg total) by mouth daily. ?Start taking on: Apr 04, 2022 ?  ?  omeprazole 20 MG capsule ?Commonly known as: PRILOSEC ?Take 20 mg by mouth daily. ?  ?oxymetazoline 0.05 % nasal spray ?Commonly known as: AFRIN ?Place 1 spray into both nostrils 2 (two) times daily as needed for congestion. ?  ?vitamin C 1000 MG tablet ?Take 1,000 mg by mouth daily. ?  ?Vitamin D3 50 MCG (2000 UT) Tabs ?Take 2 tablets by mouth daily. ?  ?zolpidem 10 MG tablet ?Commonly known as: AMBIEN ?Take 10 mg by mouth at bedtime as needed for sleep. ?  ? ?  ? ?  ?  ? ? ?  ?Durable Medical Equipment  ?(From admission, onward)  ?  ? ? ?  ? ?  Start     Ordered  ? 04/03/22 0825  For home use only DME Walker rolling  Once       ?Comments: 5 wheel  ?Question Answer Comment  ?Walker: With 5 Inch Wheels   ?Patient needs a walker to treat with the following condition Weakness   ?  ? 04/03/22 0824  ? ?  ?  ? ?  ? ? ? Follow-up Information   ? ? Shon Baton, MD. Schedule an appointment as soon as possible for a visit in 1 week(s).   ?Specialty: Internal Medicine ?Contact information: ?[address] ?[place] [address] ?[phone] ? ? ?  ?  ? ? [facility]. Schedule an appointment as soon as possible for a visit in 2 week(s).   ?Why: CVA ?Contact information: ?[place]     [place] [address] ?[phone] ? ?  ?  ? ?  ?  ? ?  ? ? ?Major procedures and Radiology Reports - PLEASE review detailed and final reports thoroughly  -    ? ? ?  ?CT ANGIO HEAD NECK W WO CM ? ?Result Date: 04/02/2022 ?CLINICAL DATA:  Stroke follow-up EXAM: CT  ANGIOGRAPHY HEAD AND NECK TECHNIQUE: Multidetector CT imaging of the head and neck was performed using the standard protocol during bolus administration of intravenous contrast. Multiplanar CT image reconstructions a

## 2022-04-03 NOTE — Progress Notes (Signed)
Physical Therapy Treatment ?Patient Details ?Name: Laura Riddle ?MRN: [id number] ?DOB: [birth year] ?Today's Date: 04/03/2022 ? ? ?History of Present Illness 87 y/o female presented to ED on 04/01/22 for dizziness and slurred speech. CT showed hypodensity in R thalamus concerning for acute infarct. MRI confirmed acute to subacute lacunar infarct of medial R thalamus. PMH: HTN, chronic migraine, hypothyroidism. ? ?  ?PT Comments  ? ? Pt tolerates treatment well, ambulating for household distances without significant balance deviations. Vestibular assessment appears negative, only with reports of lightheadedness intermittently after completion of lateral head turns. PT provides education on limiting risk for orthostatic hypotension. Pt will benefit from further dynamic balance training upon return to ILF.  ?Recommendations for follow up therapy are one component of a multi-disciplinary discharge planning process, led by the attending physician.  Recommendations may be updated based on patient status, additional functional criteria and insurance authorization. ? ?Follow Up Recommendations ? Home health PT ?  ?  ?Assistance Recommended at Discharge Intermittent Supervision/Assistance  ?Patient can return home with the following Help with stairs or ramp for entrance;Assist for transportation ?  ?Equipment Recommendations ? None recommended by PT  ?  ?Recommendations for Other Services   ? ? ?  ?Precautions / Restrictions Precautions ?Precautions: Fall ?Precaution Comments: lightheaded, orthostaitc during admission ?Restrictions ?Weight Bearing Restrictions: No  ?  ? ?Mobility ? Bed Mobility ?Overal bed mobility: Modified Independent ?  ?  ?  ?  ?  ?  ?General bed mobility comments: HOB elevated ?  ? ?Transfers ?Overall transfer level: Modified independent ?Equipment used:  (walking stick) ?Transfers: Sit to/from Stand ?Sit to Stand: Modified independent (Device/Increase time) ?  ?  ?  ?  ?  ?  ?   ? ?Ambulation/Gait ?Ambulation/Gait assistance: Supervision ?Gait Distance (Feet): 200 Feet ?Assistive device:  (walking stick) ?Gait Pattern/deviations: Step-through pattern ?Gait velocity: reduced ?Gait velocity interpretation: <1.8 ft/sec, indicate of risk for recurrent falls ?  ?General Gait Details: pt with slow but steady step-through gait, one instance of mild lateral drift with vertical head turns but no LOB noted ? ? ?Stairs ?  ?  ?  ?  ?  ? ? ?Wheelchair Mobility ?  ? ?Modified Rankin (Stroke Patients Only) ?  ? ? ?  ?Balance Overall balance assessment: Needs assistance ?Sitting-balance support: No upper extremity supported, Feet supported ?Sitting balance-Leahy Scale: Good ?  ?  ?Standing balance support: No upper extremity supported, During functional activity ?Standing balance-Leahy Scale: Fair ?  ?  ?  ?  ?  ?  ?  ?  ?  ?  ?  ?  ?  ? ?  ?Cognition Arousal/Alertness: Awake/alert ?Behavior During Therapy: [facility] for tasks assessed/performed ?Overall Cognitive Status: Within Functional Limits for tasks assessed ?  ?  ?  ?  ?  ?  ?  ?  ?  ?  ?  ?  ?  ?  ?  ?  ?  ?  ?  ? ?  ?Exercises   ? ?  ?General Comments General comments (skin integrity, edema, etc.): VSS on RA. Vestibular assessment: smooth pursuits intact in all directions, initially pursuits to right with saccadic movement but this improves with 2nd and 3rd attempt. No symptoms. Saccades WFL and asymptomatic. VOR with reports of lightheadedness after stopping lateral turns. HIT negative bilaterally. ?  ?  ? ?Pertinent Vitals/Pain Pain Assessment ?Pain Assessment: Faces ?Faces Pain Scale: Hurts little more ?Pain Location: pressure behind eyes ?Pain Descriptors / Indicators: Pressure ?Pain  Intervention(s): Monitored during session  ? ? ?Home Living   ?  ?  ?  ?  ?  ?  ?  ?  ?  ?   ?  ?Prior Function    ?  ?  ?   ? ?PT Goals (current goals can now be found in the care plan section) Acute Rehab PT Goals ?Patient Stated Goal: to improve balance ?Progress  towards PT goals: Progressing toward goals ? ?  ?Frequency ? ? ? Min 4X/week ? ? ? ?  ?PT Plan Current plan remains appropriate  ? ? ?Co-evaluation   ?  ?  ?  ?  ? ?  ?AM-PAC PT "6 Clicks" Mobility   ?Outcome Measure ? Help needed turning from your back to your side while in a flat bed without using bedrails?: None ?Help needed moving from lying on your back to sitting on the side of a flat bed without using bedrails?: None ?Help needed moving to and from a bed to a chair (including a wheelchair)?: None ?Help needed standing up from a chair using your arms (e.g., wheelchair or bedside chair)?: None ?Help needed to walk in hospital room?: A Little ?Help needed climbing 3-5 steps with a railing? : A Little ?6 Click Score: 22 ? ?  ?End of Session   ?Activity Tolerance: Patient tolerated treatment well ?Patient left: in bed;with call bell/phone within reach ?Nurse Communication: Mobility status ?PT Visit Diagnosis: Unsteadiness on feet (R26.81);Muscle weakness (generalized) (M62.81);Dizziness and giddiness (R42) ?  ? ? ?Time: 9450-3888 ?PT Time Calculation (min) (ACUTE ONLY): 28 min ? ?Charges:  $Gait Training: 8-22 mins ?$Therapeutic Activity: 8-22 mins          ?          ? ?Zenaida Niece, PT, DPT ?Acute Rehabilitation ?Pager: [phone] ?Office [phone] ? ? ? ?Zenaida Niece ?04/03/2022, 9:07 AM ? ?

## 2022-04-03 NOTE — Progress Notes (Signed)
Occupational Therapy Treatment ?Patient Details ?Name: Laura Riddle ?MRN: [id number] ?DOB: [birth year] ?Today's Date: 04/03/2022 ? ? ?History of present illness 87 y/o female presented to ED on 04/01/22 for dizziness and slurred speech. CT showed hypodensity in R thalamus concerning for acute infarct. MRI confirmed acute to subacute lacunar infarct of medial R thalamus. PMH: HTN, chronic migraine, hypothyroidism. ?  ?OT comments ? Pt seen for OT session, focus on education and compensatory strategies for low vision and fall risk prevention. Pt verbalizes understanding and states she has already implemented some of these strategies at home. Pt visual and cognitive assessment WNL, reports double vision has resolved and feels cognition is at baseline. Pt able to walk short distance in room to gather items in prep for d/c with supervision. Pt presenting with impairments listed  below, will follow acutely. Continue to recommend HHOT at d/c.  ? ?Recommendations for follow up therapy are one component of a multi-disciplinary discharge planning process, led by the attending physician.  Recommendations may be updated based on patient status, additional functional criteria and insurance authorization. ?   ?Follow Up Recommendations ? Home health OT  ?  ?Assistance Recommended at Discharge Intermittent Supervision/Assistance  ?Patient can return home with the following ? A little help with walking and/or transfers;A little help with bathing/dressing/bathroom;Assistance with cooking/housework;Assist for transportation;Help with stairs or ramp for entrance ?  ?Equipment Recommendations ? None recommended by OT  ?  ?Recommendations for Other Services   ? ?  ?Precautions / Restrictions Precautions ?Precautions: Fall ?Precaution Comments: lightheaded, orthostaitc during admission ?Restrictions ?Weight Bearing Restrictions: No  ? ? ?  ? ?Mobility Bed Mobility ?  ?  ?  ?  ?  ?  ?  ?General bed mobility comments: OOB in chair upon  arrival ?  ? ?Transfers ?Overall transfer level: Modified independent ?  ?  ?  ?  ?  ?  ?  ?  ?  ?  ?  ?Balance Overall balance assessment: Needs assistance ?Sitting-balance support: No upper extremity supported, Feet supported ?Sitting balance-Leahy Scale: Good ?  ?  ?Standing balance support: No upper extremity supported, During functional activity ?Standing balance-Leahy Scale: Fair ?  ?  ?  ?  ?  ?  ?  ?  ?  ?  ?  ?  ?   ? ?ADL either performed or assessed with clinical judgement  ? ?ADL Overall ADL's : Needs assistance/impaired ?  ?  ?  ?  ?  ?  ?  ?  ?  ?  ?  ?  ?Toilet Transfer: Supervision/safety;Ambulation ?Toilet Transfer Details (indicate cue type and reason): simulated in room ?  ?  ?  ?  ?  ?  ?  ? ?Extremity/Trunk Assessment Upper Extremity Assessment ?Upper Extremity Assessment: Overall WFL for tasks assessed ?  ?Lower Extremity Assessment ?Lower Extremity Assessment: Defer to PT evaluation ?  ?  ?  ? ?Vision   ?Vision Assessment?: Yes ?Eye Alignment: Within Functional Limits ?Ocular Range of Motion: Within Functional Limits ?Alignment/Gaze Preference: Within Defined Limits ?Tracking/Visual Pursuits: Able to track stimulus in all quads without difficulty ?Visual Fields: No apparent deficits ?Additional Comments: pt reports diplopia has resolved ?  ?Perception Perception ?Perception: Not tested ?  ?Praxis Praxis ?Praxis: Not tested ?  ? ?Cognition Arousal/Alertness: Awake/alert ?Behavior During Therapy: [facility] for tasks assessed/performed ?Overall Cognitive Status: Within Functional Limits for tasks assessed ?  ?  ?  ?  ?  ?  ?  ?  ?  ?  ?  ?  ?  ?  ?  ?  ?  General Comments: Scoring 0 on SBT indicative of normal cognition ?  ?  ?   ?Exercises   ? ?  ?Shoulder Instructions   ? ? ?  ?General Comments VSS on RA, educated pt on low vision/safety techniques for home, pt verbalized understanding, reporting no concerns with ADLs/mobiliity at this time  ? ? ?Pertinent Vitals/ Pain       Pain Assessment ?Pain  Assessment: No/denies pain ?Faces Pain Scale: Hurts little more ?Pain Location: pressure behind eyes ?Pain Descriptors / Indicators: Pressure ?Pain Intervention(s): Limited activity within patient's tolerance, Monitored during session, Repositioned ? ?Home Living   ?  ?  ?  ?  ?  ?  ?  ?  ?  ?  ?  ?  ?  ?  ?  ?  ?  ?  ? ?  ?Prior Functioning/Environment    ?  ?  ?  ?   ? ?Frequency ? Min 2X/week  ? ? ? ? ?  ?Progress Toward Goals ? ?OT Goals(current goals can now be found in the care plan section) ? Progress towards OT goals: Progressing toward goals ? ?Acute Rehab OT Goals ?Patient Stated Goal: to go home ?OT Goal Formulation: With patient ?Time For Goal Achievement: 04/16/22 ?Potential to Achieve Goals: Good ?ADL Goals ?Pt Will Transfer to Toilet: Independently;ambulating ?Additional ADL Goal #1: Patient will tolerate 10 minutes of dynamic standing activity in order to safely participate in self care tasks. ?Additional ADL Goal #2: Patient will visually attend to environmental stimuli without loss of balance during daily tasks.  ?Plan Discharge plan remains appropriate;Frequency remains appropriate   ? ?Co-evaluation ? ? ?   ?  ?  ?  ?  ? ?  ?AM-PAC OT "6 Clicks" Daily Activity     ?Outcome Measure ? ? Help from another person eating meals?: None ?Help from another person taking care of personal grooming?: A Little ?Help from another person toileting, which includes using toliet, bedpan, or urinal?: A Little ?Help from another person bathing (including washing, rinsing, drying)?: A Little ?Help from another person to put on and taking off regular upper body clothing?: A Little ?Help from another person to put on and taking off regular lower body clothing?: A Little ?6 Click Score: 19 ? ?  ?End of Session   ? ?OT Visit Diagnosis: Unsteadiness on feet (R26.81);Other symptoms and signs involving the nervous system (R29.898) ?  ?Activity Tolerance Patient tolerated treatment well ?  ?Patient Left in chair ?  ?Nurse  Communication Mobility status ?  ? ?   ? ?Time: 7544-9201 ?OT Time Calculation (min): 16 min ? ?Charges: OT General Charges ?$OT Visit: 1 Visit ?OT Treatments ?$Self Care/Home Management : 8-22 mins ? ?Lynnda Child, OTD, OTR/L ?Acute Rehab ?[phone] ? ? ?Kaylyn Lim ?04/03/2022, 10:05 AM ?

## 2022-04-15 DIAGNOSIS — I639 Cerebral infarction, unspecified: Secondary | ICD-10-CM | POA: Diagnosis not present

## 2022-04-15 DIAGNOSIS — R2689 Other abnormalities of gait and mobility: Secondary | ICD-10-CM | POA: Diagnosis not present

## 2022-04-21 DIAGNOSIS — Z23 Encounter for immunization: Secondary | ICD-10-CM | POA: Diagnosis not present

## 2022-05-03 DIAGNOSIS — R2689 Other abnormalities of gait and mobility: Secondary | ICD-10-CM | POA: Diagnosis not present

## 2022-05-03 DIAGNOSIS — I639 Cerebral infarction, unspecified: Secondary | ICD-10-CM | POA: Diagnosis not present

## 2022-05-03 DIAGNOSIS — R269 Unspecified abnormalities of gait and mobility: Secondary | ICD-10-CM | POA: Diagnosis not present

## 2022-05-10 DIAGNOSIS — I6990 Unspecified sequelae of unspecified cerebrovascular disease: Secondary | ICD-10-CM | POA: Diagnosis not present

## 2022-05-10 DIAGNOSIS — I639 Cerebral infarction, unspecified: Secondary | ICD-10-CM | POA: Diagnosis not present

## 2022-05-10 DIAGNOSIS — F332 Major depressive disorder, recurrent severe without psychotic features: Secondary | ICD-10-CM | POA: Insufficient documentation

## 2022-05-10 DIAGNOSIS — R269 Unspecified abnormalities of gait and mobility: Secondary | ICD-10-CM | POA: Diagnosis not present

## 2022-05-10 DIAGNOSIS — R2689 Other abnormalities of gait and mobility: Secondary | ICD-10-CM | POA: Diagnosis not present

## 2022-05-10 DIAGNOSIS — I10 Essential (primary) hypertension: Secondary | ICD-10-CM | POA: Diagnosis not present

## 2022-05-10 DIAGNOSIS — E119 Type 2 diabetes mellitus without complications: Secondary | ICD-10-CM | POA: Diagnosis not present

## 2022-05-11 ENCOUNTER — Encounter: Payer: Self-pay | Admitting: Neurology

## 2022-05-11 ENCOUNTER — Ambulatory Visit (INDEPENDENT_AMBULATORY_CARE_PROVIDER_SITE_OTHER): Payer: Medicare Other | Admitting: Neurology

## 2022-05-11 VITALS — BP 117/66 | HR 83 | Ht 66.0 in | Wt 163.2 lb

## 2022-05-11 DIAGNOSIS — I639 Cerebral infarction, unspecified: Secondary | ICD-10-CM | POA: Diagnosis not present

## 2022-05-11 MED ORDER — NURTEC 75 MG PO TBDP: 75.0000 mg | ORAL_TABLET | Freq: Every day | ORAL | 0 refills | Status: DC | PRN

## 2022-05-11 NOTE — Patient Instructions (Addendum)
30-day heart monitor and if negative for afib we should send for loop recorder.  For migraines: Nurtec samples and if you like it we can prescribe it.  Follow up with Dr. Ernestina Patches for injections for bursitis and sciatica   Stroke Prevention Some medical conditions and behaviors can lead to a higher chance of having a stroke. You can help prevent a stroke by eating healthy, exercising, not smoking, and managing any medical conditions you have. Stroke is a leading cause of functional impairment. Primary prevention is particularly important because a majority of strokes are first-time events. Stroke changes the lives of not only those who experience a stroke but also their family and other caregivers. How can this condition affect me? A stroke is a medical emergency and should be treated right away. A stroke can lead to brain damage and can sometimes be life-threatening. If a person gets medical treatment right away, there is a better chance of surviving and recovering from a stroke. What can increase my risk? The following medical conditions may increase your risk of a stroke: Cardiovascular disease. High blood pressure (hypertension). Diabetes. High cholesterol. Sickle cell disease. Blood clotting disorders (hypercoagulable state). Obesity. Sleep disorders (obstructive sleep apnea). Other risk factors include: Being older than age 42. Having a history of blood clots, stroke, or mini-stroke (transient ischemic attack, TIA). Genetic factors, such as race, ethnicity, or a family history of stroke. Smoking cigarettes or using other tobacco products. Taking birth control pills, especially if you also use tobacco. Heavy use of alcohol or drugs, especially cocaine and methamphetamine. Physical inactivity. What actions can I take to prevent this? Manage your health conditions High cholesterol levels. Eating a healthy diet is important for preventing high cholesterol. If cholesterol cannot be  managed through diet alone, you may need to take medicines. Take any prescribed medicines to control your cholesterol as told by your health care provider. Hypertension. To reduce your risk of stroke, try to keep your blood pressure below 130/80. Eating a healthy diet and exercising regularly are important for controlling blood pressure. If these steps are not enough to manage your blood pressure, you may need to take medicines. Take any prescribed medicines to control hypertension as told by your health care provider. Ask your health care provider if you should monitor your blood pressure at home. Have your blood pressure checked every year, even if your blood pressure is normal. Blood pressure increases with age and some medical conditions. Diabetes. Eating a healthy diet and exercising regularly are important parts of managing your blood sugar (glucose). If your blood sugar cannot be managed through diet and exercise, you may need to take medicines. Take any prescribed medicines to control your diabetes as told by your health care provider. Get evaluated for obstructive sleep apnea. Talk to your health care provider about getting a sleep evaluation if you snore a lot or have excessive sleepiness. Make sure that any other medical conditions you have, such as atrial fibrillation or atherosclerosis, are managed. Nutrition Follow instructions from your health care provider about what to eat or drink to help manage your health condition. These instructions may include: Reducing your daily calorie intake. Limiting how much salt (sodium) you use to 1,500 milligrams (mg) each day. Using only healthy fats for cooking, such as olive oil, canola oil, or sunflower oil. Eating healthy foods. You can do this by: Choosing foods that are high in fiber, such as whole grains, and fresh fruits and vegetables. Eating at least 5 servings of  fruits and vegetables a day. Try to fill one-half of your plate with  fruits and vegetables at each meal. Choosing lean protein foods, such as lean cuts of meat, poultry without skin, fish, tofu, beans, and nuts. Eating low-fat dairy products. Avoiding foods that are high in sodium. This can help lower blood pressure. Avoiding foods that have saturated fat, trans fat, and cholesterol. This can help prevent high cholesterol. Avoiding processed and prepared foods. Counting your daily carbohydrate intake.  Lifestyle If you drink alcohol: Limit how much you have to: 0-1 drink a day for women who are not pregnant. 0-2 drinks a day for men. Know how much alcohol is in your drink. In the [place] one drink equals one 12 oz bottle of beer (354m), one 5 oz glass of wine (1468m, or one 1 oz glass of hard liquor (449m Do not use any products that contain nicotine or tobacco. These products include cigarettes, chewing tobacco, and vaping devices, such as e-cigarettes. If you need help quitting, ask your health care provider. Avoid secondhand smoke. Do not use drugs. Activity  Try to stay at a healthy weight. Get at least 30 minutes of exercise on most days, such as: Fast walking. Biking. Swimming. Medicines Take over-the-counter and prescription medicines only as told by your health care provider. Aspirin or blood thinners (antiplatelets or anticoagulants) may be recommended to reduce your risk of forming blood clots that can lead to stroke. Avoid taking birth control pills. Talk to your health care provider about the risks of taking birth control pills if: You are over 35 35ars old. You smoke. You get very bad headaches. You have had a blood clot. Where to find more information American Stroke Association: www.strokeassociation.org Get help right away if: You or a loved one has any symptoms of a stroke. "BE FAST" is an easy way to remember the main warning signs of a stroke: B - Balance. Signs are dizziness, sudden trouble walking, or loss of balance. E -  Eyes. Signs are trouble seeing or a sudden change in vision. F - Face. Signs are sudden weakness or numbness of the face, or the face or eyelid drooping on one side. A - Arms. Signs are weakness or numbness in an arm. This happens suddenly and usually on one side of the body. S - Speech. Signs are sudden trouble speaking, slurred speech, or trouble understanding what people say. T - Time. Time to call emergency services. Write down what time symptoms started. You or a loved one has other signs of a stroke, such as: A sudden, severe headache with no known cause. Nausea or vomiting. Seizure. These symptoms may represent a serious problem that is an emergency. Do not wait to see if the symptoms will go away. Get medical help right away. Call your local emergency services (911 in the [place] Do not drive yourself to the hospital. Summary You can help to prevent a stroke by eating healthy, exercising, not smoking, limiting alcohol intake, and managing any medical conditions you may have. Do not use any products that contain nicotine or tobacco. These include cigarettes, chewing tobacco, and vaping devices, such as e-cigarettes. If you need help quitting, ask your health care provider. Remember "BE FAST" for warning signs of a stroke. Get help right away if you or a loved one has any of these signs. This information is not intended to replace advice given to you by your health care provider. Make sure you discuss any questions you have with your  health care provider. Document Revised: 06/15/2020 Document Reviewed: 06/15/2020 Elsevier Patient Education  [place] Disintegrating Tablets What is this medication? RIMEGEPANT (ri ME je pant) prevents and treats migraines. It works by blocking a substance in the body that causes migraines. This medicine may be used for other purposes; ask your health care provider or pharmacist if you have questions. COMMON BRAND NAME(S): NURTEC  ODT What should I tell my care team before I take this medication? They need to know if you have any of these conditions: Kidney disease Liver disease An unusual or allergic reaction to rimegepant, other medications, foods, dyes, or preservatives Pregnant or trying to get pregnant Breast-feeding How should I use this medication? Take this medication by mouth. Take it as directed on the prescription label. Leave the tablet in the sealed pack until you are ready to take it. With dry hands, open the pack and gently remove the tablet. If the tablet breaks or crumbles, throw it away. Use a new tablet. Place the tablet in the mouth and allow it to dissolve. Then, swallow it. Do not cut, crush, or chew this medication. You do not need water to take this medication. Talk to your care team about the use of this medication in children. Special care may be needed. Overdosage: If you think you have taken too much of this medicine contact a poison control center or emergency room at once. NOTE: This medicine is only for you. Do not share this medicine with others. What if I miss a dose? This does not apply. This medication is not for regular use. What may interact with this medication? Certain medications for fungal infections, such as fluconazole, itraconazole Rifampin This list may not describe all possible interactions. Give your health care provider a list of all the medicines, herbs, non-prescription drugs, or dietary supplements you use. Also tell them if you smoke, drink alcohol, or use illegal drugs. Some items may interact with your medicine. What should I watch for while using this medication? Visit your care team for regular checks on your progress. Tell your care team if your symptoms do not start to get better or if they get worse. What side effects may I notice from receiving this medication? Side effects that you should report to your care team as soon as possible: Allergic reactions--skin  rash, itching, hives, swelling of the face, lips, tongue, or throat Side effects that usually do not require medical attention (report to your care team if they continue or are bothersome): Nausea Stomach pain This list may not describe all possible side effects. Call your doctor for medical advice about side effects. You may report side effects to FDA at 1-800-FDA-1088. Where should I keep my medication? Keep out of the reach of children and pets. Store at room temperature between 20 and 25 degrees C (68 and 77 degrees F). Get rid of any unused medication after the expiration date. To get rid of medications that are no longer needed or have expired: Take the medication to a medication take-back program. Check with your pharmacy or law enforcement to find a location. If you cannot return the medication, check the label or package insert to see if the medication should be thrown out in the garbage or flushed down the toilet. If you are not sure, ask your care team. If it is safe to put it in the trash, take the medication out of the container. Mix the medication with cat litter, dirt, coffee grounds,  or other unwanted substance. Seal the mixture in a bag or container. Put it in the trash. NOTE: This sheet is a summary. It may not cover all possible information. If you have questions about this medicine, talk to your doctor, pharmacist, or health care provider.  2023 Elsevier/Gold Standard (2022-01-05 00:00:00)

## 2022-05-11 NOTE — Progress Notes (Unsigned)
[facility]    Provider:  Dr Jaynee Eagles Requesting Provider: Janine Ores, NP Primary Care Provider:  Shon Baton, MD  CC:  CVA  HPI:  Laura Riddle is a 87 y.o. female here as requested by Janine Ores, NP for stroke and migraines. 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 She fell asleep on the couch and was a little dizzy, the next day she went to the ED at [place] and transferred her to [facility]. MRI showed an acute to subacute stroke in the medial right thalamus. Other workup was unremarkable including echo, CTA H&N, ldl 73, hgba1c was elevated at 7.5. She was not on antiplatelet therapy prior.  She was started on DUAP for 3 weeks followed by ASA. She is going to PT because her gait is off bc she has bursitis and sciatica in the right leg. She was not on antiplatelet medication. For the last 6 months she has been having dizziness and lightheadedness. She had double vision in one eye in the past an has had episodes of lightheadedness and dizziness also, closing the right eye did not make the diplopia better. They live in [place]. No significant snoring, no excessive daytime somnolence. She has occasional migraines. When there is a storm she can tell. Sometimes she has an aura in the left eye, she vomits, she takes hydrocodone and diazepam she has not had one in several months. Pulsating, pounding pulsating, photophobia. She saw Dr. Earley Favor and she was tried on maxalt, imitrex. She takes Gabapentin at night for plantar fasciitis, she has had injections in the heel, denies numbness and tingling in the tos her foot pain is in the ankle and heel. No deficits, she feels all her deficits have resolved and she is going to PT and it is hurting her bursitis and she has had injections Dr. Ernestina Patches sees her.   Reviewed notes, labs and imaging from outside physicians, which showed:  MRI 04/02/2022: IMPRESSION: 1. Acute to subacute lacunar  infarct of the medial right thalamus. No associated hemorrhage or mass effect.   2. No other acute intracranial abnormality. Other mild to moderate for age chronic small vessel disease.  CTA H&N:  FINDINGS: CTA NECK FINDINGS   SKELETON: There is no bony spinal canal stenosis. No lytic or blastic lesion.   OTHER NECK: Normal pharynx, larynx and major salivary glands. No cervical lymphadenopathy. Unremarkable thyroid gland.   UPPER CHEST: No pneumothorax or pleural effusion. No nodules or masses.   AORTIC ARCH:   There is no calcific atherosclerosis of the aortic arch. There is no aneurysm, dissection or hemodynamically significant stenosis of the visualized portion of the aorta. Conventional 3 vessel aortic branching pattern. The visualized proximal subclavian arteries are widely patent.   RIGHT CAROTID SYSTEM: No dissection, occlusion or aneurysm. Mild atherosclerotic calcification at the carotid bifurcation without hemodynamically significant stenosis.   LEFT CAROTID SYSTEM: Normal without aneurysm, dissection or stenosis.   VERTEBRAL ARTERIES: Left dominant configuration. Both origins are clearly patent. There is no dissection, occlusion or flow-limiting stenosis to the skull base (V1-V3 segments).   CTA HEAD FINDINGS   POSTERIOR CIRCULATION:   --Vertebral arteries: Normal V4 segments.   --Inferior cerebellar arteries: Normal.   --Basilar artery: Normal.   --Superior cerebellar arteries: Normal.   --Posterior cerebral arteries (PCA): Normal.   ANTERIOR CIRCULATION:   --Intracranial internal carotid arteries: Normal.   --Anterior cerebral arteries (ACA): Normal. Both A1 segments are present. Patent anterior communicating  artery (a-comm).   --Middle cerebral arteries (MCA): Normal.   VENOUS SINUSES: As permitted by contrast timing, patent.   ANATOMIC VARIANTS: None   Review of the MIP images confirms the above findings.   IMPRESSION: 1. No emergent  large vessel occlusion or high-grade stenosis of the intracranial arteries.  Ldl 73 Hgba1c 7.5  Echocardiogram IMPRESSIONS     1. Left ventricular ejection fraction, by estimation, is 60 to 65%. The  left ventricle has normal function. The left ventricle has no regional  wall motion abnormalities. There is mild left ventricular hypertrophy.  Left ventricular diastolic parameters  were normal.   2. Right ventricular systolic function is normal. The right ventricular  size is normal. Tricuspid regurgitation signal is inadequate for assessing  PA pressure.   3. The mitral valve is grossly normal. Mild to moderate mitral valve  regurgitation.   4. The aortic valve is tricuspid. Aortic valve regurgitation is not  visualized. Aortic valve mean gradient measures 3.0 mmHg.   5. Unable to estimate CVP.   Review of Systems: Patient complains of symptoms per HPI as well as the following symptoms migraine. Pertinent negatives and positives per HPI. All others negative.   Social History   Socioeconomic History   Marital status: Married    Spouse name: Not on file   Number of children: 1   Years of education: Not on file   Highest education level: Not on file  Occupational History   Occupation: nurse  Tobacco Use   Smoking status: Never   Smokeless tobacco: Never  Vaping Use   Vaping Use: Never used  Substance and Sexual Activity   Alcohol use: No    Alcohol/week: 0.0 standard drinks of alcohol   Drug use: No   Sexual activity: Not on file  Other Topics Concern   Not on file  Social History Narrative   Not on file   Social Determinants of Health   Financial Resource Strain: Not on file  Food Insecurity: Not on file  Transportation Needs: Not on file  Physical Activity: Not on file  Stress: Not on file  Social Connections: Not on file  Intimate Partner Violence: Not on file    Family History  Problem Relation Age of Onset   Colon cancer Mother    Liver cancer  Mother    Migraines Mother    Stroke Father    Breast cancer Sister     Past Medical History:  Diagnosis Date   Colon polyps    GERD (gastroesophageal reflux disease)    Hiatal hernia    Hyperlipidemia    Hypertension    Hypothyroidism    IBS (irritable bowel syndrome)     Patient Active Problem List   Diagnosis Date Noted   Nonintractable chronic migraine    Stroke ([place]) 04/01/2022   Pain in left wrist 05/13/2021   Pain in right shoulder 02/24/2021   Essential hypertension 11/10/2009   COLONIC POLYPS, HYPERPLASTIC 11/09/2009   Hypothyroidism 11/09/2009   HYPERLIPIDEMIA 11/09/2009   GERD 11/09/2009   HIATAL HERNIA 11/09/2009   IBS 11/09/2009    Past Surgical History:  Procedure Laterality Date   ABDOMINAL HYSTERECTOMY     APPENDECTOMY     BACK SURGERY     BACK SURGERY     L5-S1   CATARACT EXTRACTION     CERVICAL FUSION     CHOLECYSTECTOMY     WRIST FRACTURE SURGERY      Current Outpatient Medications  Medication Sig Dispense Refill  amitriptyline (ELAVIL) 50 MG tablet Take 50 mg by mouth at bedtime.     Ascorbic Acid (VITAMIN C) 1000 MG tablet Take 1,000 mg by mouth daily.     aspirin EC 81 MG EC tablet Take 1 tablet (81 mg total) by mouth daily. Swallow whole. 30 tablet 0   atorvastatin (LIPITOR) 40 MG tablet Take 1 tablet (40 mg total) by mouth daily. 30 tablet 0   Cholecalciferol (VITAMIN D3) 2000 UNITS TABS Take 2 tablets by mouth daily.     diazepam (VALIUM) 5 MG tablet Take 2.5 mg by mouth every 12 (twelve) hours as needed (migraine).  0   FARXIGA 5 MG TABS tablet Take 5 mg by mouth daily.     HYDROcodone-acetaminophen (NORCO) 10-325 MG tablet Take 1 tablet by mouth 2 (two) times daily as needed (migraine).     levothyroxine (SYNTHROID, LEVOTHROID) 112 MCG tablet Take 112 mcg by mouth daily before breakfast. Brand name Synthroid     loratadine (CLARITIN) 10 MG tablet Take 10 mg by mouth daily.     Multiple Vitamin (MULTIVITAMIN) tablet Take 1 tablet by  mouth daily.     olmesartan (BENICAR) 40 MG tablet Take 1 tablet (40 mg total) by mouth daily.     omeprazole (PRILOSEC) 20 MG capsule Take 20 mg by mouth daily.     oxymetazoline (AFRIN) 0.05 % nasal spray Place 1 spray into both nostrils 2 (two) times daily as needed for congestion.     Rimegepant Sulfate (NURTEC) 75 MG TBDP Take 75 mg by mouth daily as needed. For migraines. Take as close to onset of migraine as possible. One daily maximum. 4 tablet 0   zolpidem (AMBIEN) 10 MG tablet Take 10 mg by mouth at bedtime as needed for sleep.     famotidine (PEPCID) 20 MG tablet Take 1 tablet (20 mg total) by mouth daily. 30 tablet 0   No current facility-administered medications for this visit.    Allergies as of 05/11/2022 - Review Complete 05/11/2022  Allergen Reaction Noted   Penicillins Anaphylaxis and Rash 04/02/2022   Morphine     Nitrofurantoin      Vitals: BP 117/66   Pulse 83   Ht '5\' 6"'$  (1.676 m)   Wt 163 lb 3.2 oz (74 kg)   BMI 26.34 kg/m  Last Weight:  Wt Readings from Last 1 Encounters:  05/11/22 163 lb 3.2 oz (74 kg)   Last Height:   Ht Readings from Last 1 Encounters:  05/11/22 '5\' 6"'$  (1.676 m)     Physical exam: Exam: Gen: NAD, conversant, well nourised, obese, well groomed                     CV: RRR, no MRG. No Carotid Bruits. No peripheral edema, warm, nontender Eyes: Conjunctivae clear without exudates or hemorrhage  Neuro: Detailed Neurologic Exam  Speech:    Speech is normal; fluent and spontaneous with normal comprehension.  Cognition:    The patient is oriented to person, place, and time;     recent and remote memory intact;     language fluent;     normal attention, concentration,     fund of knowledge Cranial Nerves:    The pupils are equal, round, and reactive to light. The fundi are normal and spontaneous venous pulsations are present. Visual fields are full to finger confrontation. Extraocular movements are intact. Trigeminal sensation is  intact and the muscles of mastication are normal. The face is symmetric.  The palate elevates in the midline. Hearing intact. Voice is normal. Shoulder shrug is normal. The tongue has normal motion without fasciculations.   Coordination:    Normal finger to nose and heel to shin. Normal rapid alternating movements.   Gait:   antalgic  Motor Observation:    No asymmetry, no atrophy, and no involuntary movements noted. Tone:    Normal muscle tone.    Posture:    Posture is normal. normal erect    Strength:    Strength is V/V in the upper and lower limbs.      Sensation: intact to LT     Reflex Exam:  DTR's:    decreased right patellat otherwise normal Toes:    The toes are downgoing bilaterally.   Clonus:    Clonus is absent.    Assessment/Plan:  87 y.o. female here as requested by Janine Ores, NP for stroke and migraines. 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 She fell asleep on the couch and was a little dizzy, the next day she went to the ED at [place] and transferred her to [facility]. MRI showed an acute to subacute stroke in the medial right thalamus. Other workup was unremarkable including echo, CTA H&N, ldl 73, hgba1c was elevated at 7.5. She was not on antiplatelet therapy prior.  She was started on DUAP for 3 weeks followed by ASA.  - even though the stroke is an area that is associated with lacunar infarct, the area of the stroke was larger than expected cannot rule out embolic phenomenon. - 30-day heart monitor and if negative for afib we should send for loop recorder.  - For migraines: Nurtec samples and if you like it we can prescribe it.  - Follow up with Dr. Ernestina Patches for injections for bursitis and sciatica   I had a long d/w patient about her recent stroke, risk for recurrent stroke/TIAs, personally independently reviewed imaging studies and stroke evaluation results and answered questions.Continue ASA for secondary  stroke prevention and maintain strict control of hypertension with blood pressure goal below 130/90, diabetes with hemoglobin A1c goal below 6.5% and lipids with LDL cholesterol goal below 70 mg/dL. I also advised the patient to eat a healthy diet with plenty of whole grains, lean meats, fruits and vegetables, exercise regularly and maintain ideal body weight .   Orders Placed This Encounter  Procedures   Cardiac event monitor   Meds ordered this encounter  Medications   Rimegepant Sulfate (NURTEC) 75 MG TBDP    Sig: Take 75 mg by mouth daily as needed. For migraines. Take as close to onset of migraine as possible. One daily maximum.    Dispense:  4 tablet    Refill:  0    Cc: Janine Ores, NP,  Shon Baton, MD  Sarina Ill, MD  [facility] [address] [place], [place] [address]  Phone [phone] Fax [phone]

## 2022-05-13 ENCOUNTER — Other Ambulatory Visit: Payer: Self-pay | Admitting: Neurology

## 2022-05-13 DIAGNOSIS — R269 Unspecified abnormalities of gait and mobility: Secondary | ICD-10-CM | POA: Diagnosis not present

## 2022-05-13 DIAGNOSIS — R42 Dizziness and giddiness: Secondary | ICD-10-CM

## 2022-05-13 DIAGNOSIS — I4891 Unspecified atrial fibrillation: Secondary | ICD-10-CM

## 2022-05-13 DIAGNOSIS — R2689 Other abnormalities of gait and mobility: Secondary | ICD-10-CM | POA: Diagnosis not present

## 2022-05-13 DIAGNOSIS — I639 Cerebral infarction, unspecified: Secondary | ICD-10-CM

## 2022-05-15 ENCOUNTER — Encounter: Payer: Self-pay | Admitting: Neurology

## 2022-05-17 DIAGNOSIS — R2689 Other abnormalities of gait and mobility: Secondary | ICD-10-CM | POA: Diagnosis not present

## 2022-05-17 DIAGNOSIS — R269 Unspecified abnormalities of gait and mobility: Secondary | ICD-10-CM | POA: Diagnosis not present

## 2022-05-17 DIAGNOSIS — I639 Cerebral infarction, unspecified: Secondary | ICD-10-CM | POA: Diagnosis not present

## 2022-05-21 ENCOUNTER — Ambulatory Visit (INDEPENDENT_AMBULATORY_CARE_PROVIDER_SITE_OTHER): Payer: Medicare Other

## 2022-05-21 DIAGNOSIS — I639 Cerebral infarction, unspecified: Secondary | ICD-10-CM

## 2022-05-21 DIAGNOSIS — R42 Dizziness and giddiness: Secondary | ICD-10-CM

## 2022-05-21 DIAGNOSIS — I4891 Unspecified atrial fibrillation: Secondary | ICD-10-CM | POA: Diagnosis not present

## 2022-05-26 DIAGNOSIS — R269 Unspecified abnormalities of gait and mobility: Secondary | ICD-10-CM | POA: Diagnosis not present

## 2022-05-26 DIAGNOSIS — R2689 Other abnormalities of gait and mobility: Secondary | ICD-10-CM | POA: Diagnosis not present

## 2022-05-26 DIAGNOSIS — I639 Cerebral infarction, unspecified: Secondary | ICD-10-CM | POA: Diagnosis not present

## 2022-06-07 DIAGNOSIS — R269 Unspecified abnormalities of gait and mobility: Secondary | ICD-10-CM | POA: Diagnosis not present

## 2022-06-07 DIAGNOSIS — I639 Cerebral infarction, unspecified: Secondary | ICD-10-CM | POA: Diagnosis not present

## 2022-06-07 DIAGNOSIS — R2689 Other abnormalities of gait and mobility: Secondary | ICD-10-CM | POA: Diagnosis not present

## 2022-07-05 DIAGNOSIS — E039 Hypothyroidism, unspecified: Secondary | ICD-10-CM | POA: Diagnosis not present

## 2022-07-05 DIAGNOSIS — E785 Hyperlipidemia, unspecified: Secondary | ICD-10-CM | POA: Diagnosis not present

## 2022-07-05 DIAGNOSIS — Z79899 Other long term (current) drug therapy: Secondary | ICD-10-CM | POA: Diagnosis not present

## 2022-07-05 DIAGNOSIS — M8589 Other specified disorders of bone density and structure, multiple sites: Secondary | ICD-10-CM | POA: Diagnosis not present

## 2022-07-05 DIAGNOSIS — R739 Hyperglycemia, unspecified: Secondary | ICD-10-CM | POA: Diagnosis not present

## 2022-07-05 DIAGNOSIS — R7989 Other specified abnormal findings of blood chemistry: Secondary | ICD-10-CM | POA: Diagnosis not present

## 2022-07-05 DIAGNOSIS — D649 Anemia, unspecified: Secondary | ICD-10-CM | POA: Diagnosis not present

## 2022-07-05 DIAGNOSIS — I10 Essential (primary) hypertension: Secondary | ICD-10-CM | POA: Diagnosis not present

## 2022-07-05 DIAGNOSIS — M1990 Unspecified osteoarthritis, unspecified site: Secondary | ICD-10-CM | POA: Diagnosis not present

## 2022-07-06 DIAGNOSIS — Z1231 Encounter for screening mammogram for malignant neoplasm of breast: Secondary | ICD-10-CM | POA: Diagnosis not present

## 2022-07-12 ENCOUNTER — Telehealth: Payer: Self-pay | Admitting: Physical Medicine and Rehabilitation

## 2022-07-12 DIAGNOSIS — R82998 Other abnormal findings in urine: Secondary | ICD-10-CM | POA: Diagnosis not present

## 2022-07-12 NOTE — Telephone Encounter (Signed)
Pt called requesting an right hip injection appt. Pt phone number is [phone].

## 2022-08-03 ENCOUNTER — Ambulatory Visit (INDEPENDENT_AMBULATORY_CARE_PROVIDER_SITE_OTHER): Payer: Medicare Other | Admitting: Physical Medicine and Rehabilitation

## 2022-08-03 ENCOUNTER — Ambulatory Visit: Payer: Medicare Other

## 2022-08-03 ENCOUNTER — Encounter: Payer: Self-pay | Admitting: Physical Medicine and Rehabilitation

## 2022-08-03 DIAGNOSIS — I639 Cerebral infarction, unspecified: Secondary | ICD-10-CM | POA: Diagnosis not present

## 2022-08-03 DIAGNOSIS — M25551 Pain in right hip: Secondary | ICD-10-CM

## 2022-08-03 DIAGNOSIS — M533 Sacrococcygeal disorders, not elsewhere classified: Secondary | ICD-10-CM

## 2022-08-03 DIAGNOSIS — M7061 Trochanteric bursitis, right hip: Secondary | ICD-10-CM

## 2022-08-03 DIAGNOSIS — Z9889 Other specified postprocedural states: Secondary | ICD-10-CM

## 2022-08-03 MED ORDER — LIDOCAINE HCL 2 % IJ SOLN
4.0000 mL | INTRAMUSCULAR | Status: AC | PRN
  Administered 2022-08-03: 4 mL

## 2022-08-03 MED ORDER — BUPIVACAINE HCL 0.25 % IJ SOLN
4.0000 mL | INTRAMUSCULAR | Status: AC | PRN
  Administered 2022-08-03: 4 mL via INTRA_ARTICULAR

## 2022-08-03 MED ORDER — TRIAMCINOLONE ACETONIDE 40 MG/ML IJ SUSP
40.0000 mg | INTRAMUSCULAR | Status: AC | PRN
  Administered 2022-08-03: 40 mg via INTRA_ARTICULAR

## 2022-08-03 NOTE — Progress Notes (Signed)
[name] - 87 y.o. female MRN [id number]  Date of birth: [birth year]  Office Visit Note: Visit Date: 08/03/2022 PCP: Shon Baton, MD Referred by: Shon Baton, MD  Subjective: Chief Complaint  Patient presents with   Right Hip - Pain   HPI: Laura Riddle is a 87 y.o. female who comes in today for evaluation and management of continued mostly right lateral hip pain some low back and buttock pain and some pain into the lateral thigh.  This is all right-sided symptoms chronically.  She is a patient that I last saw in November and completed fluoroscopically guided greater trochanteric injection with good relief of symptoms at the time.  She reports since then she was doing well she really does a lot of activities that she normally can do and the symptoms just seem to progressively get worse with activity more recently.  No specific falls or trauma.  No focal weakness no paresthesias or tingling.  She does have a history of remote lumbar spine surgery at L5-S1.  No recent lumbar imaging.  She does point to pain over the greater trochanter but some referral up and down and posteriorly to that area.  She denies any groin pain.  She is a patient with some chronic pain history with chronic migraine for which she does take some amitriptyline at night as well as hydrocodone as needed and is taking gabapentin.  She is a long-term patient of Dr. Joni Fears which is how we initially saw her many years ago.  She has seen his assistant Biagio Borg on numerous occasions for multiple areas of tendinitis and bursitis.  No history of fibromyalgia.      Review of Systems  Musculoskeletal:  Positive for back pain and joint pain. Negative for falls.  All other systems reviewed and are negative.  Otherwise per HPI.  Assessment & Plan: Visit Diagnoses:    ICD-10-CM   1. Greater trochanteric bursitis, right  M70.61 XR C-ARM NO REPORT    2. Pain in right hip  M25.551     3. Sacroiliac pain   M53.3     4. History of lumbar surgery  Z98.890        Plan: Findings:  Multifactorial pain of the pelvic and sacroiliac area and greater trochanter.  Does seem to have most of her pain over the greater trochanter.  We decided today to go ahead and repeat that injection that was performed last year.  We have done these with fluoroscopic guidance and it does seem to work better for her over the long-term.  Consider imaging of the lumbar spine if needed if this seem to be more radicular it would likely declare itself if the injection did not seem to help very much.  Reiterated the use of stretching and staying active with her.    Meds & Orders: No orders of the defined types were placed in this encounter.   Orders Placed This Encounter  Procedures   Large Joint Inj   XR C-ARM NO REPORT    Follow-up: Return for visit to requesting provider as needed.   Procedures: Large Joint Inj: R greater trochanter on 08/03/2022 9:30 AM Indications: pain and diagnostic evaluation Details: 22 G 3.5 in needle, fluoroscopy-guided lateral approach  Arthrogram: No  Medications: 4 mL lidocaine 2 %; 4 mL bupivacaine 0.25 %; 40 mg triamcinolone acetonide 40 MG/ML Outcome: tolerated well, no immediate complications  There was excellent flow of contrast outlined the greater trochanteric bursa without vascular  uptake. Procedure, treatment alternatives, risks and benefits explained, specific risks discussed. Consent was given by the patient. Immediately prior to procedure a time out was called to verify the correct patient, procedure, equipment, support staff and site/side marked as required. Patient was prepped and draped in the usual sterile fashion.          Clinical History: No specialty comments available.   She reports that she has never smoked. She has never used smokeless tobacco.  Recent Labs    04/02/22 0138  HGBA1C 7.5*    Objective:  VS:  HT:    WT:   BMI:     BP:   HR: bpm  TEMP: ( )   RESP:  Physical Exam Vitals and nursing note reviewed.  Constitutional:      General: She is not in acute distress.    Appearance: Normal appearance. She is well-developed. She is not ill-appearing.  HENT:     Head: Normocephalic and atraumatic.     Right Ear: External ear normal.     Left Ear: External ear normal.  Eyes:     Extraocular Movements: Extraocular movements intact.     Conjunctiva/sclera: Conjunctivae normal.     Pupils: Pupils are equal, round, and reactive to light.  Cardiovascular:     Rate and Rhythm: Normal rate.     Pulses: Normal pulses.  Pulmonary:     Effort: Pulmonary effort is normal. No respiratory distress.  Abdominal:     General: There is no distension.     Palpations: Abdomen is soft.  Musculoskeletal:        General: Tenderness present.     Cervical back: Neck supple.     Right lower leg: No edema.     Left lower leg: No edema.     Comments: Patient has good distal strength with some concordant pain over the right greater trochanter.  She does have some pain with hip rotation but no groin pain.  She ambulates without aid with normal gait.  She has good distal strength without clonus.  Skin:    General: Skin is warm and dry.     Findings: No erythema, lesion or rash.  Neurological:     General: No focal deficit present.     Mental Status: She is alert and oriented to person, place, and time.     Sensory: No sensory deficit.     Motor: No weakness or abnormal muscle tone.     Coordination: Coordination normal.     Gait: Gait normal.  Psychiatric:        Mood and Affect: Mood normal.        Behavior: Behavior normal.     Ortho Exam  Imaging: XR C-ARM NO REPORT  Result Date: 08/03/2022 Please see Notes tab for imaging impression.   Past Medical/Family/Surgical/Social History: Medications & Allergies reviewed per EMR, new medications updated. Patient Active Problem List   Diagnosis Date Noted   Severe episode of recurrent major  depressive disorder, without psychotic features ([place]) 05/10/2022   Nonintractable chronic migraine    Stroke ([place]) 04/01/2022   Pain in left wrist 05/13/2021   Pain in right shoulder 02/24/2021   Essential hypertension 11/10/2009   COLONIC POLYPS, HYPERPLASTIC 11/09/2009   Hypothyroidism 11/09/2009   HYPERLIPIDEMIA 11/09/2009   GERD 11/09/2009   HIATAL HERNIA 11/09/2009   IBS 11/09/2009   Past Medical History:  Diagnosis Date   Colon polyps    GERD (gastroesophageal reflux disease)    Hiatal hernia  Hyperlipidemia    Hypertension    Hypothyroidism    IBS (irritable bowel syndrome)    Family History  Problem Relation Age of Onset   Colon cancer Mother    Liver cancer Mother    Migraines Mother    Stroke Father    Breast cancer Sister    Past Surgical History:  Procedure Laterality Date   ABDOMINAL HYSTERECTOMY     APPENDECTOMY     BACK SURGERY     BACK SURGERY     L5-S1   CATARACT EXTRACTION     CERVICAL FUSION     CHOLECYSTECTOMY     WRIST FRACTURE SURGERY     Social History   Occupational History   Occupation: nurse  Tobacco Use   Smoking status: Never   Smokeless tobacco: Never  Vaping Use   Vaping Use: Never used  Substance and Sexual Activity   Alcohol use: No    Alcohol/week: 0.0 standard drinks of alcohol   Drug use: No   Sexual activity: Not on file

## 2022-08-03 NOTE — Progress Notes (Signed)
Pt state right hip pain. Pt state any movement makes the pain worse. Pt state she takes over the counter pain meds to help ease her pain.  Numeric Pain Rating Scale and Functional Assessment Average Pain 6   In the last MONTH (on 0-10 scale) has pain interfered with the following?  1. General activity like being  able to carry out your everyday physical activities such as walking, climbing stairs, carrying groceries, or moving a chair?  Rating(9)    -BT, -Dye Allergies.

## 2022-09-17 DIAGNOSIS — Z23 Encounter for immunization: Secondary | ICD-10-CM | POA: Diagnosis not present

## 2022-11-14 ENCOUNTER — Encounter: Payer: Self-pay | Admitting: Neurology

## 2022-11-14 ENCOUNTER — Ambulatory Visit (INDEPENDENT_AMBULATORY_CARE_PROVIDER_SITE_OTHER): Payer: Medicare Other | Admitting: Neurology

## 2022-11-14 VITALS — BP 129/60 | HR 93 | Ht 67.0 in | Wt 158.8 lb

## 2022-11-14 DIAGNOSIS — R42 Dizziness and giddiness: Secondary | ICD-10-CM | POA: Insufficient documentation

## 2022-11-14 DIAGNOSIS — R413 Other amnesia: Secondary | ICD-10-CM

## 2022-11-14 DIAGNOSIS — R5383 Other fatigue: Secondary | ICD-10-CM | POA: Insufficient documentation

## 2022-11-14 DIAGNOSIS — I951 Orthostatic hypotension: Secondary | ICD-10-CM | POA: Diagnosis not present

## 2022-11-14 DIAGNOSIS — I639 Cerebral infarction, unspecified: Secondary | ICD-10-CM

## 2022-11-14 DIAGNOSIS — G43711 Chronic migraine without aura, intractable, with status migrainosus: Secondary | ICD-10-CM | POA: Insufficient documentation

## 2022-11-14 NOTE — Patient Instructions (Signed)
Stroke: 30-day heart monitor was unremarkable. Consider loop recorder.  Dizziness:  She has dizziness, she is not walking steadily, dizziness has been ongoing. Dizziness/ lightheaded when she changes positions in the bed she has a momentary room spinning.  We can send to vestibular therapy.  Memory problems she feels like it is age related or post-stroke: recommend discussing decreasing meds like gabapentin and elavil and hydrocodone discuss with primary care. Consider formal memory testing. Consider cognitive therapy. Medications inclide Donepezil or namenda but would recommend a thorough evaluation first  She is fatigued, she deny any symptoms of sleep apnea, doesn't nod off during the day, no snoring. She is managed by dr Virgina Jock for her hypothyroidism. Doesn't wake up with headaches.   Headache/migraines: Nurtec did not help, gave her stomach problems. Since the stroke she has a daily headaches.  Would recommend Ajovy or Emgality and maybe try to wean off of the amitriptyline.

## 2022-11-14 NOTE — Progress Notes (Signed)
[facility]    Provider:  Dr Jaynee Eagles Requesting Provider: Shon Baton, MD Primary Care Provider:  Shon Baton, MD  CC:  CVA and several other concerns  11/14/2022: Stroke: Likely lacunar but a little larger than expected recommended loop recorder: 30-day heart monitor was unremarkable. She declines loop recorder.   She has ongoing dizziness, she is not walking steadily, dizziness has been ongoing. Dizziness/ lightheaded when she changes positions in the bed she has a momentary room spinning. We can send to vestibular therapy.  Memory problems: she feels like it is age related or post-stroke: recommend discussing decreasing meds like gabapentin and elavil and hydrocodone with primary care. Sometimes she can't come up with the right word,she will forget where she puts things, she can't find things, short term memory loss. She declines formal memory testing. She declines cognitive therapy. Discussed Donepezil or namenda and she declines. She declines MMSE.   She is fatigued/no energy, she deny any symptoms of sleep apnea, doesn't nod off during the day, no snoring. She is managed by dr Adair Laundry her hypothyroidism. Doesn't wake up with headaches. F/u pcp. Recommended discussing decreasing meds like gabapentin and elavil and hydrocodone with primary care  Headache/migraines: Nurtec did not help, gave her stomach problems. Since the stroke she has a daily headaches. She can have up to 8 migraine days a month. Would recommend Ajovy or Emgality and maybe try to wean off of the amitriptyline.   Orthostatic: 145/61 P83 laying, 129/62 P92 sitting, standing 105/61 P109, f/u with primary care.    Patient complains of symptoms per HPI as well as the following symptoms: fatigue . Pertinent negatives and positives per HPI. All others negative   HPI:  Laura Riddle is a 87 y.o. female here as requested by Shon Baton, MD for stroke and migraines. 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 She fell asleep on the couch and was a little dizzy, the next day she went to the ED at [place] and transferred her to [facility]. MRI showed an acute to subacute stroke in the medial right thalamus. Other workup was unremarkable including echo, CTA H&N, ldl 73, hgba1c was elevated at 7.5. She was not on antiplatelet therapy prior.  She was started on DUAP for 3 weeks followed by ASA. She is going to PT because her gait is off bc she has bursitis and sciatica in the right leg. She was not on antiplatelet medication. For the last 6 months she has been having dizziness and lightheadedness. She had double vision in one eye in the past an has had episodes of lightheadedness and dizziness also, closing the right eye did not make the diplopia better. They live in [place]. No significant snoring, no excessive daytime somnolence. She has occasional migraines. When there is a storm she can tell. Sometimes she has an aura in the left eye, she vomits, she takes hydrocodone and diazepam she has not had one in several months. Pulsating, pounding pulsating, photophobia. She saw Dr. Earley Favor and she was tried on maxalt, imitrex. She takes Gabapentin at night for plantar fasciitis, she has had injections in the heel, denies numbness and tingling in the tos her foot pain is in the ankle and heel. No deficits, she feels all her deficits have resolved and she is going to PT and it is hurting her bursitis and she has had injections Dr. Ernestina Patches sees her.   Reviewed notes, labs and imaging from  outside physicians, which showed:  MRI 04/02/2022: IMPRESSION: 1. Acute to subacute lacunar infarct of the medial right thalamus. No associated hemorrhage or mass effect.   2. No other acute intracranial abnormality. Other mild to moderate for age chronic small vessel disease.  CTA H&N:  FINDINGS: CTA NECK FINDINGS   SKELETON: There is no bony spinal canal stenosis. No lytic  or blastic lesion.   OTHER NECK: Normal pharynx, larynx and major salivary glands. No cervical lymphadenopathy. Unremarkable thyroid gland.   UPPER CHEST: No pneumothorax or pleural effusion. No nodules or masses.   AORTIC ARCH:   There is no calcific atherosclerosis of the aortic arch. There is no aneurysm, dissection or hemodynamically significant stenosis of the visualized portion of the aorta. Conventional 3 vessel aortic branching pattern. The visualized proximal subclavian arteries are widely patent.   RIGHT CAROTID SYSTEM: No dissection, occlusion or aneurysm. Mild atherosclerotic calcification at the carotid bifurcation without hemodynamically significant stenosis.   LEFT CAROTID SYSTEM: Normal without aneurysm, dissection or stenosis.   VERTEBRAL ARTERIES: Left dominant configuration. Both origins are clearly patent. There is no dissection, occlusion or flow-limiting stenosis to the skull base (V1-V3 segments).   CTA HEAD FINDINGS   POSTERIOR CIRCULATION:   --Vertebral arteries: Normal V4 segments.   --Inferior cerebellar arteries: Normal.   --Basilar artery: Normal.   --Superior cerebellar arteries: Normal.   --Posterior cerebral arteries (PCA): Normal.   ANTERIOR CIRCULATION:   --Intracranial internal carotid arteries: Normal.   --Anterior cerebral arteries (ACA): Normal. Both A1 segments are present. Patent anterior communicating artery (a-comm).   --Middle cerebral arteries (MCA): Normal.   VENOUS SINUSES: As permitted by contrast timing, patent.   ANATOMIC VARIANTS: None   Review of the MIP images confirms the above findings.   IMPRESSION: 1. No emergent large vessel occlusion or high-grade stenosis of the intracranial arteries.  Ldl 73 Hgba1c 7.5  Echocardiogram IMPRESSIONS     1. Left ventricular ejection fraction, by estimation, is 60 to 65%. The  left ventricle has normal function. The left ventricle has no regional  wall motion  abnormalities. There is mild left ventricular hypertrophy.  Left ventricular diastolic parameters  were normal.   2. Right ventricular systolic function is normal. The right ventricular  size is normal. Tricuspid regurgitation signal is inadequate for assessing  PA pressure.   3. The mitral valve is grossly normal. Mild to moderate mitral valve  regurgitation.   4. The aortic valve is tricuspid. Aortic valve regurgitation is not  visualized. Aortic valve mean gradient measures 3.0 mmHg.   5. Unable to estimate CVP.   Review of Systems: Patient complains of symptoms per HPI as well as the following symptoms migraine. Pertinent negatives and positives per HPI. All others negative.   Social History   Socioeconomic History   Marital status: Married    Spouse name: Not on file   Number of children: 1   Years of education: Not on file   Highest education level: Not on file  Occupational History   Occupation: nurse  Tobacco Use   Smoking status: Never   Smokeless tobacco: Never  Vaping Use   Vaping Use: Never used  Substance and Sexual Activity   Alcohol use: No    Alcohol/week: 0.0 standard drinks of alcohol   Drug use: No   Sexual activity: Not on file  Other Topics Concern   Not on file  Social History Narrative   Not on file   Social Determinants of Health   Financial  Resource Strain: Not on file  Food Insecurity: Not on file  Transportation Needs: Not on file  Physical Activity: Not on file  Stress: Not on file  Social Connections: Not on file  Intimate Partner Violence: Not on file    Family History  Problem Relation Age of Onset   Colon cancer Mother    Liver cancer Mother    Migraines Mother    Hypertension Father    Breast cancer Sister    Stroke Sister     Past Medical History:  Diagnosis Date   Colon polyps    GERD (gastroesophageal reflux disease)    Hiatal hernia    Hyperlipidemia    Hypertension    Hypothyroidism    IBS (irritable bowel  syndrome)     Patient Active Problem List   Diagnosis Date Noted   Dizziness 11/14/2022   Vertigo 11/14/2022   Orthostatic hypotension 11/14/2022   Other fatigue 11/14/2022   Chronic migraine without aura, with intractable migraine, so stated, with status migrainosus 11/14/2022   Complaints of memory disturbance 11/14/2022   Severe episode of recurrent major depressive disorder, without psychotic features ([place]) 05/10/2022   Nonintractable chronic migraine    Stroke ([place]) 04/01/2022   Pain in left wrist 05/13/2021   Pain in right shoulder 02/24/2021   Essential hypertension 11/10/2009   COLONIC POLYPS, HYPERPLASTIC 11/09/2009   Hypothyroidism 11/09/2009   HYPERLIPIDEMIA 11/09/2009   GERD 11/09/2009   HIATAL HERNIA 11/09/2009   IBS 11/09/2009    Past Surgical History:  Procedure Laterality Date   ABDOMINAL HYSTERECTOMY     APPENDECTOMY     BACK SURGERY     BACK SURGERY     L5-S1   CATARACT EXTRACTION     CERVICAL FUSION     CHOLECYSTECTOMY     WRIST FRACTURE SURGERY      Current Outpatient Medications  Medication Sig Dispense Refill   alendronate (FOSAMAX) 70 MG tablet Take 70 mg by mouth. Pt takes medication every other week     amitriptyline (ELAVIL) 50 MG tablet TAKE 1 TABLET BY MOUTH 1  TIME BEFORE BEDTIME Orally Once a day for 90 days     Ascorbic Acid (VITAMIN C) 1000 MG tablet Take 1,000 mg by mouth daily.     aspirin EC 81 MG EC tablet Take 1 tablet (81 mg total) by mouth daily. Swallow whole. 30 tablet 0   atorvastatin (LIPITOR) 40 MG tablet Take 1 tablet (40 mg total) by mouth daily. 30 tablet 0   Cholecalciferol (VITAMIN D3) 2000 UNITS TABS Take 2 tablets by mouth daily.     diazepam (VALIUM) 5 MG tablet Take 2.5 mg by mouth every 12 (twelve) hours as needed (migraine).  0   gabapentin (NEURONTIN) 100 MG capsule Take 100 mg by mouth daily.     HYDROcodone-acetaminophen (NORCO) 10-325 MG tablet Take 1 tablet by mouth 2 (two) times daily as needed (migraine).      JARDIANCE 10 MG TABS tablet Take 10 mg by mouth daily.     levothyroxine (SYNTHROID) 112 MCG tablet TAKE 1 TABLET BY MOUTH DAILY Orally Once a day for 90 days     loratadine (CLARITIN) 10 MG tablet Take 10 mg by mouth daily.     Multiple Vitamin (MULTIVITAMIN) tablet Take 1 tablet by mouth daily.     olmesartan (BENICAR) 40 MG tablet Take 1 tablet (40 mg total) by mouth daily.     omeprazole (PRILOSEC) 20 MG capsule Take 20 mg by mouth daily.  oxymetazoline (AFRIN) 0.05 % nasal spray Place 1 spray into both nostrils 2 (two) times daily as needed for congestion.     zolpidem (AMBIEN) 10 MG tablet Take 10 mg by mouth at bedtime as needed for sleep.     No current facility-administered medications for this visit.    Allergies as of 11/14/2022 - Review Complete 11/14/2022  Allergen Reaction Noted   Penicillins Anaphylaxis and Rash 04/02/2022   Azelaic acid  05/23/2013   Metformin  08/03/2022   Morphine     Nitrofurantoin      Vitals: BP 129/60   Pulse 93   Ht '5\' 7"'$  (1.702 m)   Wt 158 lb 12.8 oz (72 kg)   BMI 24.87 kg/m  Last Weight:  Wt Readings from Last 1 Encounters:  11/14/22 158 lb 12.8 oz (72 kg)   Last Height:   Ht Readings from Last 1 Encounters:  11/14/22 '5\' 7"'$  (1.702 m)    Physical exam: Exam: Gen: NAD, conversant      CV: no palpitations or chest pain or SOB. VS: Breathing at a normal rate. Not febrile. Eyes: Conjunctivae clear without exudates or hemorrhage  Neuro: Detailed Neurologic Exam  Speech:    Speech is normal; fluent and spontaneous with normal comprehension.  Cognition:    The patient is oriented to person, place, and time;     recent and remote memory intact;     language fluent;     normal attention, concentration,     fund of knowledge Cranial Nerves:    The pupils are equal, round, and reactive to light. Attempted, Cannot perform fundoscopic exam. Visual fields are full to finger confrontation. Extraocular movements are intact.  The  face is symmetric with normal sensation. The palate elevates in the midline. Hearing intact. Voice is normal. Shoulder shrug is normal. The tongue has normal motion without fasciculations.   Coordination:    No dysmetria or ataxia noted  Motor Observation:   no involuntary movements noted. Tone:    Appears normal  Posture:    Posture is normal. normal erect    Strength:    Strength is anti-gravity and symmetric in the upper and lower limbs.         Assessment/Plan:  87 y.o. female here as requested by Janine Ores, NP for stroke and migraines. 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 She fell asleep on the couch and was a little dizzy, the next day she went to the ED at [place] and transferred her to [facility]. MRI showed an acute to subacute stroke in the medial right thalamus. Other workup was unremarkable including echo, CTA H&N, ldl 73, hgba1c was elevated at 7.5. She was not on antiplatelet therapy prior.  She was started on DUAP for 3 weeks followed by ASA.Orthostatic: 145/61 P83 laying, 129/62 P92 sitting, standing 105/61 P109, f/u with primary care.  11/14/2022: Stroke: Likely lacunar but a little larger than expected recommended loop recorder: 30-day heart monitor was unremarkable. She declines loop recorder.   She has ongoing dizziness, she is not walking steadily, dizziness has been ongoing. Dizziness/ lightheaded when she changes positions in the bed she has a momentary room spinning. We can send to vestibular therapy.  Memory problems: she feels like it is age related or post-stroke: recommend discussing decreasing meds like gabapentin and elavil and hydrocodone with primary care. Sometimes she can't come up with the right word,she will forget where she puts things, she  can't find things, short term memory loss. She declines formal memory testing. She declines cognitive therapy. Discussed Donepezil or namenda and she declines. She  declines MMSE.   She is fatigued/no energy, she deny any symptoms of sleep apnea, doesn't nod off during the day, no snoring. She is managed by dr Adair Laundry her hypothyroidism. Doesn't wake up with headaches. F/u pcp. Recommended discussing decreasing meds like gabapentin and elavil and hydrocodone with primary care. Is Orthostatic in the office today.  Headache/migraines: Nurtec did not help, gave her stomach problems. Since the stroke she has a daily headaches. She can have up to 8 migraine days a month. Would recommend Ajovy or Emgality and maybe try to wean off of the amitriptyline.   Orthostatic: 145/61 P83 laying, 129/62 P92 sitting, standing 105/61 P109, f/u with primary care.   - Follow up with Dr. Ernestina Patches for injections for bursitis and sciatica   - If no changes can repeat MRI brain to see if she has more strokes  I had a long d/w patient about her recent stroke, risk for recurrent stroke/TIAs, personally independently reviewed imaging studies and stroke evaluation results and answered questions.Continue ASA for secondary stroke prevention and maintain strict control of hypertension with blood pressure goal below 130/90, diabetes with hemoglobin A1c goal below 6.5% and lipids with LDL cholesterol goal below 70 mg/dL. I also advised the patient to eat a healthy diet with plenty of whole grains, lean meats, fruits and vegetables, exercise regularly and maintain ideal body weight .   Orders Placed This Encounter  Procedures   Ambulatory referral to Physical Therapy     Cc: Shon Baton, MD,  Shon Baton, MD  Sarina Ill, MD  [facility] [address] [place], [place] [address]  Phone [phone] Fax [phone]  I spent over 40 minutes of face-to-face and non-face-to-face time with patient on the  1. Dizziness   2. Vertigo   3. Orthostatic hypotension   4. Other fatigue   5. Chronic migraine without aura, with intractable migraine, so  stated, with status migrainosus   6. Complaints of memory disturbance    diagnosis.  This included previsit chart review, lab review, study review, order entry, electronic health record documentation, patient education on the different diagnostic and therapeutic options, counseling and coordination of care, risks and benefits of management, compliance, or risk factor reduction

## 2022-12-06 ENCOUNTER — Encounter: Payer: Medicare Other | Admitting: Physical Therapy

## 2022-12-14 ENCOUNTER — Ambulatory Visit: Payer: Medicare Other

## 2022-12-28 ENCOUNTER — Telehealth: Payer: Self-pay | Admitting: Adult Health

## 2022-12-28 NOTE — Telephone Encounter (Signed)
LVM and sent mychart msg informing pt of need to reschedule 05/18/23 appointment - NP out

## 2023-01-17 DIAGNOSIS — H52203 Unspecified astigmatism, bilateral: Secondary | ICD-10-CM | POA: Diagnosis not present

## 2023-01-17 DIAGNOSIS — E119 Type 2 diabetes mellitus without complications: Secondary | ICD-10-CM | POA: Diagnosis not present

## 2023-01-17 DIAGNOSIS — Z961 Presence of intraocular lens: Secondary | ICD-10-CM | POA: Diagnosis not present

## 2023-01-26 DIAGNOSIS — G3184 Mild cognitive impairment, so stated: Secondary | ICD-10-CM | POA: Diagnosis not present

## 2023-01-26 DIAGNOSIS — F3289 Other specified depressive episodes: Secondary | ICD-10-CM | POA: Diagnosis not present

## 2023-03-07 DIAGNOSIS — F332 Major depressive disorder, recurrent severe without psychotic features: Secondary | ICD-10-CM | POA: Diagnosis not present

## 2023-04-06 DIAGNOSIS — F332 Major depressive disorder, recurrent severe without psychotic features: Secondary | ICD-10-CM | POA: Diagnosis not present

## 2023-04-20 DIAGNOSIS — F332 Major depressive disorder, recurrent severe without psychotic features: Secondary | ICD-10-CM | POA: Diagnosis not present

## 2023-05-09 DIAGNOSIS — F332 Major depressive disorder, recurrent severe without psychotic features: Secondary | ICD-10-CM | POA: Diagnosis not present

## 2023-05-18 ENCOUNTER — Ambulatory Visit: Payer: Medicare Other | Admitting: Adult Health

## 2023-05-25 ENCOUNTER — Ambulatory Visit (INDEPENDENT_AMBULATORY_CARE_PROVIDER_SITE_OTHER): Payer: Medicare Other | Admitting: Adult Health

## 2023-05-25 ENCOUNTER — Encounter: Payer: Self-pay | Admitting: Adult Health

## 2023-05-25 VITALS — BP 103/59 | HR 83 | Ht 67.0 in | Wt 147.8 lb

## 2023-05-25 DIAGNOSIS — R42 Dizziness and giddiness: Secondary | ICD-10-CM | POA: Diagnosis not present

## 2023-05-25 DIAGNOSIS — I639 Cerebral infarction, unspecified: Secondary | ICD-10-CM | POA: Diagnosis not present

## 2023-05-25 NOTE — Patient Instructions (Signed)
Your Plan:    Continue ASA  Blood pressure goal <130/90 Cholesterol LDL goal <70 Diabetes goal A1c <7 Monitor diet and try to exercise  Keep FU with [facility] for memory If your symptoms worsen or you develop new symptoms please let us know.    Thank you for coming to see [place] at [facility]. I hope we have been able to provide you high quality care today.  You may receive a patient satisfaction survey over the next few weeks. We would appreciate your feedback and comments so that we may continue to improve ourselves and the health of our patients.

## 2023-05-25 NOTE — Progress Notes (Addendum)
PATIENT: Laura Riddle DOB: Jul 31, 1935  REASON FOR VISIT: follow up HISTORY FROM: patient PRIMARY NEUROLOGIST: Dr. Lucia Gaskins  Chief Complaint  Patient presents with   Follow-up    Pt in  5 Pt here for stroke f/u Pt states some dizziness      HISTORY OF PRESENT ILLNESS: Today 05/25/23  Laura Riddle is a 88 y.o. female who has been followed in this office for history of stroke and dizziness. Returns today for follow-up.  She reports that she has not had any additional strokelike symptoms.  As of now dizziness has improved.  She remains on aspirin.  Her primary care is managing cholesterol diabetes and blood pressure.  She did go to [facility] and had an evaluation for memory disturbance.  She is recommended to follow-up in a year there.  HISTORY 01/15/2022: Stroke: Likely lacunar but a little larger than expected recommended loop recorder: 30-day heart monitor was unremarkable. She declines loop recorder.    She has ongoing dizziness, she is not walking steadily, dizziness has been ongoing. Dizziness/ lightheaded when she changes positions in the bed she has a momentary room spinning. We can send to vestibular therapy.   Memory problems: she feels like it is age related or post-stroke: recommend discussing decreasing meds like gabapentin and elavil and hydrocodone with primary care. Sometimes she can't come up with the right word,she will forget where she puts things, she can't find things, short term memory loss. She declines formal memory testing. She declines cognitive therapy. Discussed Donepezil or namenda and she declines. She declines MMSE.    She is fatigued/no energy, she deny any symptoms of sleep apnea, doesn't nod off during the day, no snoring. She is managed by dr Hendricks Milo her hypothyroidism. Doesn't wake up with headaches. F/u pcp. Recommended discussing decreasing meds like gabapentin and elavil and hydrocodone with primary care   Headache/migraines: Nurtec did not  help, gave her stomach problems. Since the stroke she has a daily headaches. She can have up to 8 migraine days a month. Would recommend Ajovy or Emgality and maybe try to wean off of the amitriptyline.    Orthostatic: 145/61 P83 laying, 129/62 P92 sitting, standing 105/61 P109, f/u with primary care.      Patient complains of symptoms per HPI as well as the following symptoms: fatigue . Pertinent negatives and positives per HPI. All others negative     HPI:  Laura Riddle is a 88 y.o. female here as requested by Creola Corn, MD for stroke and migraines. 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 Her husband has Alzheimer's, retired Web designer. She fell asleep on the couch and was a little dizzy, the next day she went to the ED at [facility] and transferred her to [facility]. MRI showed an acute to subacute stroke in the medial right thalamus. Other workup was unremarkable including echo, CTA H&N, ldl 73, hgba1c was elevated at 7.5. She was not on antiplatelet therapy prior.  She was started on DUAP for 3 weeks followed by ASA. She is going to PT because her gait is off bc she has bursitis and sciatica in the right leg. She was not on antiplatelet medication. For the last 6 months she has been having dizziness and lightheadedness. She had double vision in one eye in the past an has had episodes of lightheadedness and dizziness also, closing the right eye did not make the diplopia better. They live in [place]. No significant snoring, no  excessive daytime somnolence. She has occasional migraines. When there is a storm she can tell. Sometimes she has an aura in the left eye, she vomits, she takes hydrocodone and diazepam she has not had one in several months. Pulsating, pounding pulsating, photophobia. She saw Dr. Meryl Crutch and she was tried on maxalt, imitrex. She takes Gabapentin at night for plantar fasciitis, she has had injections in the heel, denies numbness and tingling in the tos her foot pain is in the  ankle and heel. No deficits, she feels all her deficits have resolved and she is going to PT and it is hurting her bursitis and she has had injections Dr. Alvester Morin sees her.    Reviewed notes, labs and imaging from outside physicians, which showed:   MRI 04/02/2022: IMPRESSION: 1. Acute to subacute lacunar infarct of the medial right thalamus. No associated hemorrhage or mass effect.   2. No other acute intracranial abnormality. Other mild to moderate for age chronic small vessel disease.   CTA H&N:   FINDINGS: CTA NECK FINDINGS   SKELETON: There is no bony spinal canal stenosis. No lytic or blastic lesion.   OTHER NECK: Normal pharynx, larynx and major salivary glands. No cervical lymphadenopathy. Unremarkable thyroid gland.   UPPER CHEST: No pneumothorax or pleural effusion. No nodules or masses.   AORTIC ARCH:   There is no calcific atherosclerosis of the aortic arch. There is no aneurysm, dissection or hemodynamically significant stenosis of the visualized portion of the aorta. Conventional 3 vessel aortic branching pattern. The visualized proximal subclavian arteries are widely patent.   RIGHT CAROTID SYSTEM: No dissection, occlusion or aneurysm. Mild atherosclerotic calcification at the carotid bifurcation without hemodynamically significant stenosis.   LEFT CAROTID SYSTEM: Normal without aneurysm, dissection or stenosis.   VERTEBRAL ARTERIES: Left dominant configuration. Both origins are clearly patent. There is no dissection, occlusion or flow-limiting stenosis to the skull base (V1-V3 segments).   CTA HEAD FINDINGS   POSTERIOR CIRCULATION:   --Vertebral arteries: Normal V4 segments.   --Inferior cerebellar arteries: Normal.   --Basilar artery: Normal.   --Superior cerebellar arteries: Normal.   --Posterior cerebral arteries (PCA): Normal.   ANTERIOR CIRCULATION:   --Intracranial internal carotid arteries: Normal.   --Anterior cerebral arteries (ACA):  Normal. Both A1 segments are present. Patent anterior communicating artery (a-comm).   --Middle cerebral arteries (MCA): Normal.   VENOUS SINUSES: As permitted by contrast timing, patent.   ANATOMIC VARIANTS: None   Review of the MIP images confirms the above findings.   IMPRESSION: 1. No emergent large vessel occlusion or high-grade stenosis of the intracranial arteries.   Ldl 73 Hgba1c 7.5   Echocardiogram IMPRESSIONS     1. Left ventricular ejection fraction, by estimation, is 60 to 65%. The  left ventricle has normal function. The left ventricle has no regional  wall motion abnormalities. There is mild left ventricular hypertrophy.  Left ventricular diastolic parameters  were normal.   2. Right ventricular systolic function is normal. The right ventricular  size is normal. Tricuspid regurgitation signal is inadequate for assessing  PA pressure.   3. The mitral valve is grossly normal. Mild to moderate mitral valve  regurgitation.   4. The aortic valve is tricuspid. Aortic valve regurgitation is not  visualized. Aortic valve mean gradient measures 3.0 mmHg.   5. Unable to estimate CVP.   REVIEW OF SYSTEMS: Out of a complete 14 system review of symptoms, the patient complains only of the following symptoms, and all other reviewed systems are  negative.  ALLERGIES: Allergies  Allergen Reactions   Penicillins Anaphylaxis and Rash   Azelaic Acid     Other reaction(s): breakout w/ rash   Metformin     Other reaction(s): diarrhea, bloated.   Morphine     Severe abdominal pain   Nitrofurantoin     Unknown reaction    HOME MEDICATIONS: Outpatient Medications Prior to Visit  Medication Sig Dispense Refill   alendronate (FOSAMAX) 70 MG tablet Take 70 mg by mouth. Pt takes medication every other week     amitriptyline (ELAVIL) 50 MG tablet TAKE 1 TABLET BY MOUTH 1  TIME BEFORE BEDTIME Orally Once a day for 90 days     Ascorbic Acid (VITAMIN C) 1000 MG tablet Take 1,000  mg by mouth daily.     aspirin EC 81 MG EC tablet Take 1 tablet (81 mg total) by mouth daily. Swallow whole. 30 tablet 0   atorvastatin (LIPITOR) 40 MG tablet Take 1 tablet (40 mg total) by mouth daily. 30 tablet 0   Cholecalciferol (VITAMIN D3) 2000 UNITS TABS Take 2 tablets by mouth daily.     diazepam (VALIUM) 5 MG tablet Take 2.5 mg by mouth every 12 (twelve) hours as needed (migraine).  0   gabapentin (NEURONTIN) 100 MG capsule Take 100 mg by mouth daily.     HYDROcodone-acetaminophen (NORCO) 10-325 MG tablet Take 1 tablet by mouth 2 (two) times daily as needed (migraine).     JARDIANCE 10 MG TABS tablet Take 10 mg by mouth daily.     levothyroxine (SYNTHROID) 112 MCG tablet TAKE 1 TABLET BY MOUTH DAILY Orally Once a day for 90 days     loratadine (CLARITIN) 10 MG tablet Take 10 mg by mouth daily.     Multiple Vitamin (MULTIVITAMIN) tablet Take 1 tablet by mouth daily.     olmesartan (BENICAR) 40 MG tablet Take 1 tablet (40 mg total) by mouth daily.     omeprazole (PRILOSEC) 20 MG capsule Take 20 mg by mouth daily.     oxymetazoline (AFRIN) 0.05 % nasal spray Place 1 spray into both nostrils 2 (two) times daily as needed for congestion.     zolpidem (AMBIEN) 10 MG tablet Take 10 mg by mouth at bedtime as needed for sleep.     No facility-administered medications prior to visit.    PAST MEDICAL HISTORY: Past Medical History:  Diagnosis Date   Colon polyps    GERD (gastroesophageal reflux disease)    Hiatal hernia    Hyperlipidemia    Hypertension    Hypothyroidism    IBS (irritable bowel syndrome)     PAST SURGICAL HISTORY: Past Surgical History:  Procedure Laterality Date   ABDOMINAL HYSTERECTOMY     APPENDECTOMY     BACK SURGERY     BACK SURGERY     L5-S1   CATARACT EXTRACTION     CERVICAL FUSION     CHOLECYSTECTOMY     WRIST FRACTURE SURGERY      FAMILY HISTORY: Family History  Problem Relation Age of Onset   Colon cancer Mother    Liver cancer Mother     Migraines Mother    Hypertension Father    Breast cancer Sister    Stroke Sister     SOCIAL HISTORY: Social History   Socioeconomic History   Marital status: Married    Spouse name: Not on file   Number of children: 1   Years of education: Not on file   Highest education  level: Not on file  Occupational History   Occupation: nurse  Tobacco Use   Smoking status: Never   Smokeless tobacco: Never  Vaping Use   Vaping Use: Never used  Substance and Sexual Activity   Alcohol use: No    Alcohol/week: 0.0 standard drinks of alcohol   Drug use: No   Sexual activity: Not on file  Other Topics Concern   Not on file  Social History Narrative   Not on file   Social Determinants of Health   Financial Resource Strain: Not on file  Food Insecurity: Not on file  Transportation Needs: Not on file  Physical Activity: Not on file  Stress: Not on file  Social Connections: Not on file  Intimate Partner Violence: Not on file      PHYSICAL EXAM  Vitals:   05/25/23 0937  BP: (!) 103/59  Pulse: 83  Weight: 147 lb 12.8 oz (67 kg)  Height: 5\' 7"  (1.702 m)   Body mass index is 23.15 kg/m.  Generalized: Well developed, in no acute distress   Neurological examination  Mentation: Alert oriented to time, place, history taking. Follows all commands speech and language fluent Cranial nerve II-XII: Pupils were equal round reactive to light. Extraocular movements were full, visual field were full on confrontational test. Facial sensation and strength were normal. Head turning and shoulder shrug  were normal and symmetric. Motor: The motor testing reveals 5 over 5 strength of all 4 extremities. Good symmetric motor tone is noted throughout.  Sensory: Sensory testing is intact to soft touch on all 4 extremities. No evidence of extinction is noted.  Coordination: Cerebellar testing reveals good finger-nose-finger and heel-to-shin bilaterally.  Gait and station: Gait is normal.     DIAGNOSTIC DATA (LABS, IMAGING, TESTING) - I reviewed patient records, labs, notes, testing and imaging myself where available.  Lab Results  Component Value Date   WBC 9.2 04/02/2022   HGB 12.5 04/02/2022   HCT 38.6 04/02/2022   MCV 90.4 04/02/2022   PLT 195 04/02/2022      Component Value Date/Time   NA 140 04/01/2022 1611   K 4.1 04/01/2022 1611   CL 105 04/01/2022 1611   CO2 25 04/01/2022 1611   GLUCOSE 133 (H) 04/01/2022 1611   BUN 16 04/01/2022 1611   CREATININE 0.87 04/01/2022 1611   CALCIUM 9.6 04/01/2022 1611   GFRNONAA >60 04/01/2022 1611   Lab Results  Component Value Date   CHOL 145 04/02/2022   HDL 43 04/02/2022   LDLCALC 73 04/02/2022   TRIG 143 04/02/2022   CHOLHDL 3.4 04/02/2022   Lab Results  Component Value Date   HGBA1C 7.5 (H) 04/02/2022       ASSESSMENT AND PLAN 88 y.o. year old female  has a past medical history of Colon polyps, GERD (gastroesophageal reflux disease), Hiatal hernia, Hyperlipidemia, Hypertension, Hypothyroidism, and IBS (irritable bowel syndrome). here with:  H/O of Stroke Dizziness   Continue aspirin 81 mg daily  for secondary stroke prevention.  Discussed secondary stroke prevention measures and importance of close PCP follow up for aggressive stroke risk factor management. I have gone over the pathophysiology of stroke, warning signs and symptoms, risk factors and their management in some detail with instructions to go to the closest emergency room for symptoms of concern. HTN: BP goal <130/90.   HLD: LDL goal <70.  DMII: A1c goal<7.0.  Encouraged patient to monitor diet and encouraged exercise Advised if the dizzy episodes return or worsen she is  to let us know FU with our office PRN      Butch Penny, MSN, NP-C 05/25/2023, 9:36 AM [facility] [address], [address] [place], [place] [address] [phone]

## 2023-06-20 DIAGNOSIS — F332 Major depressive disorder, recurrent severe without psychotic features: Secondary | ICD-10-CM | POA: Diagnosis not present

## 2023-07-13 DIAGNOSIS — E039 Hypothyroidism, unspecified: Secondary | ICD-10-CM | POA: Diagnosis not present

## 2023-07-13 DIAGNOSIS — D649 Anemia, unspecified: Secondary | ICD-10-CM | POA: Diagnosis not present

## 2023-07-13 DIAGNOSIS — E119 Type 2 diabetes mellitus without complications: Secondary | ICD-10-CM | POA: Diagnosis not present

## 2023-07-13 DIAGNOSIS — I10 Essential (primary) hypertension: Secondary | ICD-10-CM | POA: Diagnosis not present

## 2023-07-13 DIAGNOSIS — M8580 Other specified disorders of bone density and structure, unspecified site: Secondary | ICD-10-CM | POA: Diagnosis not present

## 2023-07-13 DIAGNOSIS — E785 Hyperlipidemia, unspecified: Secondary | ICD-10-CM | POA: Diagnosis not present

## 2023-07-13 DIAGNOSIS — Z1231 Encounter for screening mammogram for malignant neoplasm of breast: Secondary | ICD-10-CM | POA: Diagnosis not present

## 2023-07-20 DIAGNOSIS — I10 Essential (primary) hypertension: Secondary | ICD-10-CM | POA: Diagnosis not present

## 2023-07-20 DIAGNOSIS — Z23 Encounter for immunization: Secondary | ICD-10-CM | POA: Diagnosis not present

## 2023-07-20 DIAGNOSIS — E119 Type 2 diabetes mellitus without complications: Secondary | ICD-10-CM | POA: Diagnosis not present

## 2023-07-20 DIAGNOSIS — R82998 Other abnormal findings in urine: Secondary | ICD-10-CM | POA: Diagnosis not present

## 2023-07-25 DIAGNOSIS — F3340 Major depressive disorder, recurrent, in remission, unspecified: Secondary | ICD-10-CM | POA: Diagnosis not present

## 2023-08-23 DIAGNOSIS — F3340 Major depressive disorder, recurrent, in remission, unspecified: Secondary | ICD-10-CM | POA: Diagnosis not present

## 2023-09-16 DIAGNOSIS — Z23 Encounter for immunization: Secondary | ICD-10-CM | POA: Diagnosis not present

## 2023-10-17 DIAGNOSIS — F3340 Major depressive disorder, recurrent, in remission, unspecified: Secondary | ICD-10-CM | POA: Diagnosis not present

## 2023-10-20 DIAGNOSIS — Z23 Encounter for immunization: Secondary | ICD-10-CM | POA: Diagnosis not present

## 2023-11-14 DIAGNOSIS — F3340 Major depressive disorder, recurrent, in remission, unspecified: Secondary | ICD-10-CM | POA: Diagnosis not present

## 2024-01-11 DIAGNOSIS — E039 Hypothyroidism, unspecified: Secondary | ICD-10-CM | POA: Diagnosis not present

## 2024-01-11 DIAGNOSIS — I10 Essential (primary) hypertension: Secondary | ICD-10-CM | POA: Diagnosis not present

## 2024-01-23 DIAGNOSIS — H01002 Unspecified blepharitis right lower eyelid: Secondary | ICD-10-CM | POA: Diagnosis not present

## 2024-01-23 DIAGNOSIS — Z961 Presence of intraocular lens: Secondary | ICD-10-CM | POA: Diagnosis not present

## 2024-01-23 DIAGNOSIS — H01005 Unspecified blepharitis left lower eyelid: Secondary | ICD-10-CM | POA: Diagnosis not present

## 2024-01-23 DIAGNOSIS — H52203 Unspecified astigmatism, bilateral: Secondary | ICD-10-CM | POA: Diagnosis not present

## 2024-01-23 DIAGNOSIS — E119 Type 2 diabetes mellitus without complications: Secondary | ICD-10-CM | POA: Diagnosis not present

## 2024-02-21 ENCOUNTER — Ambulatory Visit (INDEPENDENT_AMBULATORY_CARE_PROVIDER_SITE_OTHER): Admitting: Physician Assistant

## 2024-02-21 ENCOUNTER — Other Ambulatory Visit (INDEPENDENT_AMBULATORY_CARE_PROVIDER_SITE_OTHER): Payer: Self-pay

## 2024-02-21 DIAGNOSIS — M25551 Pain in right hip: Secondary | ICD-10-CM | POA: Diagnosis not present

## 2024-02-21 NOTE — Addendum Note (Signed)
 Addended by: Mardene Celeste B on: 02/21/2024 03:43 PM   Modules accepted: Orders

## 2024-02-21 NOTE — Progress Notes (Signed)
 Office Visit Note   Patient: Laura Riddle           Date of Birth: 27-Sep-1935           MRN: [id number] Visit Date: 02/21/2024              Requested by: Creola Corn, MD [address] [place],  [place] [address] PCP: Creola Corn, MD   Assessment & Plan: Visit Diagnoses:  1. Pain in right hip     Plan: We will have her undergo an MRI of the pelvis and hips to evaluate the heterotopic calcifications just inferior to the iliac crest and also evaluate the right hip cartilage.  She will follow-up again with Dr. Magnus Ivan after the MRI to go over results and discuss further treatment.  Questions were encouraged and answered at length.   Follow-Up Instructions: Return for After MRI.   Orders:  Orders Placed This Encounter  Procedures   XR HIP UNILAT W OR W/O PELVIS 2-3 VIEWS RIGHT   No orders of the defined types were placed in this encounter.     Procedures: No procedures performed   Clinical Data: No additional findings.   Subjective: Chief Complaint  Patient presents with   Right Hip - Pain    HPI Laura Riddle is 89 year old female comes in today with right hip pain.  She has been seen in the past by Dr. Alvester Morin for right greater trochanteric pain and given injection last injection 08/03/2022 that has been beneficial.  But it was short-lived.  She has had lateral and right groin pain now for some time.  First noticed a lump lateral aspect of her hip around July 2024.  No known injury.  Ranks her right hip pain to be 7 out of 10 pain currently in the office and at worst 9 out of 10.  She does have rare right groin pain.  Denies any pain left hip or groin pain.  States most of her pain is just lateral to the iliac crest region where she found a "spur".  She does note a history of Demerol and Phenergan injections in this area over years for migraines.  No numbness tingling down either leg.  Right hip pain is made worse by getting up and down and sitting for long periods of  time.   Review of Systems  Constitutional:  Negative for chills and fever.     Objective: Vital Signs: There were no vitals taken for this visit.  Physical Exam Constitutional:      Appearance: She is not ill-appearing or diaphoretic.  Pulmonary:     Effort: Pulmonary effort is normal.  Neurological:     Mental Status: She is alert and oriented to person, place, and time.  Psychiatric:        Mood and Affect: Mood normal.     Ortho Exam Bilateral hips: Good range of motion both hips.  Left hip no pain with internal/external rotation.  Right hip with slight discomfort with internal and external rotation but no limitations of range of motion.  Distal lateral to the iliac crest region on the right there is a palpable hard mobile mass.  Unable to palpate mass over the any abnormalities over the left hip region.  Tenderness over the trochanteric regions bilaterally. Specialty Comments:  No specialty comments available.  Imaging: XR HIP UNILAT W OR W/O PELVIS 2-3 VIEWS RIGHT Result Date: 02/21/2024 AP pelvis lateral view right hip: Both hips well located.  Minimal narrowing right hip  compared to the left.  Osteophyte off the most proximal portion of the right greater trochanteric region.  Heterotopic calcifications just inferior to the iliac crest bilaterally.  No bony destruction.  No other bony abnormalities or acute fractures.    PMFS History: Patient Active Problem List   Diagnosis Date Noted   Dizziness 11/14/2022   Vertigo 11/14/2022   Orthostatic hypotension 11/14/2022   Other fatigue 11/14/2022   Chronic migraine without aura, with intractable migraine, so stated, with status migrainosus 11/14/2022   Complaints of memory disturbance 11/14/2022   Severe episode of recurrent major depressive disorder, without psychotic features (HCC) 05/10/2022   Nonintractable chronic migraine    Stroke (HCC) 04/01/2022   Pain in left wrist 05/13/2021   Pain in right shoulder  02/24/2021   Essential hypertension 11/10/2009   COLONIC POLYPS, HYPERPLASTIC 11/09/2009   Hypothyroidism 11/09/2009   HYPERLIPIDEMIA 11/09/2009   GERD 11/09/2009   HIATAL HERNIA 11/09/2009   IBS 11/09/2009   Past Medical History:  Diagnosis Date   Colon polyps    GERD (gastroesophageal reflux disease)    Hiatal hernia    Hyperlipidemia    Hypertension    Hypothyroidism    IBS (irritable bowel syndrome)     Family History  Problem Relation Age of Onset   Colon cancer Mother    Liver cancer Mother    Migraines Mother    Hypertension Father    Breast cancer Sister    Stroke Sister     Past Surgical History:  Procedure Laterality Date   ABDOMINAL HYSTERECTOMY     APPENDECTOMY     BACK SURGERY     BACK SURGERY     L5-S1   CATARACT EXTRACTION     CERVICAL FUSION     CHOLECYSTECTOMY     WRIST FRACTURE SURGERY     Social History   Occupational History   Occupation: Engineer, civil (consulting)  Tobacco Use   Smoking status: Never   Smokeless tobacco: Never  Vaping Use   Vaping status: Never Used  Substance and Sexual Activity   Alcohol use: No    Alcohol/week: 0.0 standard drinks of alcohol   Drug use: No   Sexual activity: Not on file

## 2024-03-06 ENCOUNTER — Ambulatory Visit
Admission: RE | Admit: 2024-03-06 | Discharge: 2024-03-06 | Disposition: A | Discharge status: Home / Self Care | Source: Ambulatory Visit | Attending: Physician Assistant | Admitting: Physician Assistant

## 2024-03-06 DIAGNOSIS — G8929 Other chronic pain: Secondary | ICD-10-CM | POA: Diagnosis not present

## 2024-03-06 DIAGNOSIS — M25551 Pain in right hip: Secondary | ICD-10-CM

## 2024-03-06 DIAGNOSIS — M7061 Trochanteric bursitis, right hip: Secondary | ICD-10-CM | POA: Diagnosis not present

## 2024-03-06 DIAGNOSIS — M7601 Gluteal tendinitis, right hip: Secondary | ICD-10-CM | POA: Diagnosis not present

## 2024-03-13 DIAGNOSIS — L821 Other seborrheic keratosis: Secondary | ICD-10-CM | POA: Diagnosis not present

## 2024-03-13 DIAGNOSIS — I788 Other diseases of capillaries: Secondary | ICD-10-CM | POA: Diagnosis not present

## 2024-03-13 DIAGNOSIS — L812 Freckles: Secondary | ICD-10-CM | POA: Diagnosis not present

## 2024-03-13 DIAGNOSIS — L718 Other rosacea: Secondary | ICD-10-CM | POA: Diagnosis not present

## 2024-03-25 ENCOUNTER — Encounter: Payer: Self-pay | Admitting: Orthopaedic Surgery

## 2024-03-25 ENCOUNTER — Ambulatory Visit (INDEPENDENT_AMBULATORY_CARE_PROVIDER_SITE_OTHER): Admitting: Orthopaedic Surgery

## 2024-03-25 DIAGNOSIS — M25551 Pain in right hip: Secondary | ICD-10-CM | POA: Diagnosis not present

## 2024-03-25 NOTE — Progress Notes (Signed)
 The patient is an 89 year old female that worsening in follow-up after having a MRI of her pelvis as well as her right hip.  She had been dealing with trochanteric pain and injections in her trochanteric area for some time.  She does report a remote history of having injections around both proximal gluteal areas over the years way back over 20 years ago for chronic migraines.  Her plain films showed significant calcifications along the pelvic rim and iliac crest on both sides and just below this.  You can actually palpate the calcifications on the right side.  She says she does have more comfort when she sleeps on her left side and not on the right hip trochanteric area.  Her right hip moves smoothly and fluidly.  There is a little bit of pain over the trochanteric area and the MRI of her right hip does show some partial tearing of the gluteus medius and minimus in this area.  The hip joint itself looks great.  The MRI of her pelvis shows these calcifications that are more consistent with most likely remote injections and not a pathologic process.  There were no worrisome features.  I did print out the x-rays for her and we talked about this being from injections which this gave her relief.  If she does have any worsening problems she knows to let us  know but I would not recommend any type of intervention at this point given how she is doing.  She agrees as well.  If things worsen she will let us  know.

## 2024-05-02 DIAGNOSIS — H0012 Chalazion right lower eyelid: Secondary | ICD-10-CM | POA: Diagnosis not present

## 2024-05-09 DIAGNOSIS — H0012 Chalazion right lower eyelid: Secondary | ICD-10-CM | POA: Diagnosis not present

## 2024-05-15 ENCOUNTER — Telehealth: Payer: Self-pay | Admitting: Physician Assistant

## 2024-05-15 NOTE — Telephone Encounter (Signed)
 Patient called and said she needs an injection in right hip. [place] told her to talk to Norma Beckers first. [phone]

## 2024-05-16 ENCOUNTER — Telehealth: Payer: Self-pay

## 2024-05-16 NOTE — Telephone Encounter (Signed)
 LVM

## 2024-05-20 ENCOUNTER — Ambulatory Visit: Admitting: Physician Assistant

## 2024-05-27 ENCOUNTER — Ambulatory Visit (INDEPENDENT_AMBULATORY_CARE_PROVIDER_SITE_OTHER): Admitting: Physician Assistant

## 2024-05-27 DIAGNOSIS — M7061 Trochanteric bursitis, right hip: Secondary | ICD-10-CM | POA: Diagnosis not present

## 2024-05-27 MED ORDER — METHYLPREDNISOLONE ACETATE 40 MG/ML IJ SUSP
40.0000 mg | INTRAMUSCULAR | Status: AC | PRN
  Administered 2024-05-27: 40 mg via INTRA_ARTICULAR

## 2024-05-27 MED ORDER — LIDOCAINE HCL 1 % IJ SOLN
5.0000 mL | INTRAMUSCULAR | Status: AC | PRN
  Administered 2024-05-27: 5 mL

## 2024-05-27 NOTE — Progress Notes (Signed)
 Office Visit Note   Patient: Laura Riddle           Date of Birth: 07/06/1935           MRN: [id number] Visit Date: 05/27/2024              Requested by: Onita Rush, MD [address] [place],  [place] [address] PCP: Onita Rush, MD   Assessment & Plan: Visit Diagnoses:  1. Trochanteric bursitis, right hip     Plan: Patient is a former patient of Dr. Anderson.  She has a history of trochanteric bursitis of her right hip.  Had injections and done well in the past.  No recent injury.  Also had a history of heterotrophic bone formation bilaterally thought maybe secondary she thinks to recurrent injection she had in her hips when she was young for her migraines.  Went forward with a injection today I told her if she had more problems with the heterotopic ossifications could refer her to Dr. Burnetta to see if another injection might be appropriate  Follow-Up Instructions: Return if symptoms worsen or fail to improve.   Orders:  No orders of the defined types were placed in this encounter.  No orders of the defined types were placed in this encounter.     Procedures: Large Joint Inj: R greater trochanter on 05/27/2024 3:30 PM Indications: pain and diagnostic evaluation Details: 22 G 1.5 in and 3.5 in needle, lateral approach  Arthrogram: No  Medications: 5 mL lidocaine  1 %; 40 mg methylPREDNISolone  acetate 40 MG/ML Outcome: tolerated well, no immediate complications Procedure, treatment alternatives, risks and benefits explained, specific risks discussed. Consent was given by the patient.     Clinical Data: No additional findings.   Subjective: No chief complaint on file.   HPI pleasant 89 year old woman no recent history presents today with pain over her trochanteric bursa has had injections in the past and done well Review of Systems  All other systems reviewed and are negative.    Objective: Vital Signs: There were no vitals taken for this visit.  Physical  Exam HENT:     Head: Normocephalic and atraumatic.  Pulmonary:     Effort: Pulmonary effort is normal.   Skin:    General: Skin is warm and dry.   Neurological:     General: No focal deficit present.     Mental Status: She is oriented to person, place, and time.   Psychiatric:        Mood and Affect: Mood normal.        Behavior: Behavior normal.    Ortho Exam Examination she has tenderness over the trochanteric bursa.  No redness no erythema she is neurovascular intact.  She also has palpable calcifications inferior to the iliac crest Specialty Comments:  No specialty comments available.  Imaging: No results found.   PMFS History: Patient Active Problem List   Diagnosis Date Noted  . Trochanteric bursitis, right hip 05/27/2024  . Dizziness 11/14/2022  . Vertigo 11/14/2022  . Orthostatic hypotension 11/14/2022  . Other fatigue 11/14/2022  . Chronic migraine without aura, with intractable migraine, so stated, with status migrainosus 11/14/2022  . Complaints of memory disturbance 11/14/2022  . Severe episode of recurrent major depressive disorder, without psychotic features (HCC) 05/10/2022  . Nonintractable chronic migraine   . Stroke (HCC) 04/01/2022  . Pain in left wrist 05/13/2021  . Pain in right shoulder 02/24/2021  . Essential hypertension 11/10/2009  . COLONIC POLYPS, HYPERPLASTIC 11/09/2009  .  Hypothyroidism 11/09/2009  . HYPERLIPIDEMIA 11/09/2009  . GERD 11/09/2009  . HIATAL HERNIA 11/09/2009  . IBS 11/09/2009   Past Medical History:  Diagnosis Date  . Colon polyps   . GERD (gastroesophageal reflux disease)   . Hiatal hernia   . Hyperlipidemia   . Hypertension   . Hypothyroidism   . IBS (irritable bowel syndrome)     Family History  Problem Relation Age of Onset  . Colon cancer Mother   . Liver cancer Mother   . Migraines Mother   . Hypertension Father   . Breast cancer Sister   . Stroke Sister     Past Surgical History:  Procedure  Laterality Date  . ABDOMINAL HYSTERECTOMY    . APPENDECTOMY    . BACK SURGERY    . BACK SURGERY     L5-S1  . CATARACT EXTRACTION    . CERVICAL FUSION    . CHOLECYSTECTOMY    . WRIST FRACTURE SURGERY     Social History   Occupational History  . Occupation: nurse  Tobacco Use  . Smoking status: Never  . Smokeless tobacco: Never  Vaping Use  . Vaping status: Never Used  Substance and Sexual Activity  . Alcohol use: No    Alcohol/week: 0.0 standard drinks of alcohol  . Drug use: No  . Sexual activity: Not on file

## 2024-07-16 DIAGNOSIS — I10 Essential (primary) hypertension: Secondary | ICD-10-CM | POA: Diagnosis not present

## 2024-07-16 DIAGNOSIS — E119 Type 2 diabetes mellitus without complications: Secondary | ICD-10-CM | POA: Diagnosis not present

## 2024-07-16 DIAGNOSIS — E039 Hypothyroidism, unspecified: Secondary | ICD-10-CM | POA: Diagnosis not present

## 2024-07-16 DIAGNOSIS — E785 Hyperlipidemia, unspecified: Secondary | ICD-10-CM | POA: Diagnosis not present

## 2024-07-16 DIAGNOSIS — M8580 Other specified disorders of bone density and structure, unspecified site: Secondary | ICD-10-CM | POA: Diagnosis not present

## 2024-07-16 DIAGNOSIS — E7849 Other hyperlipidemia: Secondary | ICD-10-CM | POA: Diagnosis not present

## 2024-07-16 DIAGNOSIS — D649 Anemia, unspecified: Secondary | ICD-10-CM | POA: Diagnosis not present

## 2024-07-18 DIAGNOSIS — Z1231 Encounter for screening mammogram for malignant neoplasm of breast: Secondary | ICD-10-CM | POA: Diagnosis not present

## 2024-07-23 DIAGNOSIS — Z0000 Encounter for general adult medical examination without abnormal findings: Secondary | ICD-10-CM | POA: Diagnosis not present

## 2024-07-23 DIAGNOSIS — Z1339 Encounter for screening examination for other mental health and behavioral disorders: Secondary | ICD-10-CM | POA: Diagnosis not present

## 2024-07-23 DIAGNOSIS — R82998 Other abnormal findings in urine: Secondary | ICD-10-CM | POA: Diagnosis not present

## 2024-07-23 DIAGNOSIS — Z1331 Encounter for screening for depression: Secondary | ICD-10-CM | POA: Diagnosis not present

## 2024-07-23 DIAGNOSIS — Z66 Do not resuscitate: Secondary | ICD-10-CM | POA: Diagnosis not present

## 2024-08-08 DIAGNOSIS — Z23 Encounter for immunization: Secondary | ICD-10-CM | POA: Diagnosis not present

## 2024-09-30 ENCOUNTER — Encounter: Payer: Self-pay | Admitting: Radiology
